# Patient Record
Sex: Male | Born: 1985 | Race: Black or African American | Hispanic: No | Marital: Single | Smoking: Current some day smoker
Health system: Southern US, Community
[De-identification: ages and names within clinical notes are randomized; demographics above are authoritative.]

## PROBLEM LIST (undated history)

## (undated) DIAGNOSIS — J329 Chronic sinusitis, unspecified: Secondary | ICD-10-CM

## (undated) DIAGNOSIS — S62309A Unspecified fracture of unspecified metacarpal bone, initial encounter for closed fracture: Secondary | ICD-10-CM

## (undated) DIAGNOSIS — K2970 Gastritis, unspecified, without bleeding: Secondary | ICD-10-CM

## (undated) HISTORY — PX: NO PAST SURGERIES: SHX2092

---
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
## 2009-02-16 ENCOUNTER — Emergency Department (HOSPITAL_COMMUNITY): Admission: EM | Admit: 2009-02-16 | Discharge: 2009-02-16 | Payer: Self-pay | Admitting: Emergency Medicine

## 2012-07-24 ENCOUNTER — Emergency Department (HOSPITAL_COMMUNITY)
Admission: EM | Admit: 2012-07-24 | Discharge: 2012-07-24 | Disposition: A | Discharge status: Home / Self Care | Payer: No Typology Code available for payment source | Attending: Emergency Medicine | Admitting: Emergency Medicine

## 2012-07-24 ENCOUNTER — Encounter (HOSPITAL_COMMUNITY): Payer: Self-pay | Admitting: Emergency Medicine

## 2012-07-24 DIAGNOSIS — M549 Dorsalgia, unspecified: Secondary | ICD-10-CM | POA: Insufficient documentation

## 2012-07-24 DIAGNOSIS — R51 Headache: Secondary | ICD-10-CM | POA: Insufficient documentation

## 2012-07-24 DIAGNOSIS — Y9389 Activity, other specified: Secondary | ICD-10-CM | POA: Insufficient documentation

## 2012-07-24 DIAGNOSIS — Y92410 Unspecified street and highway as the place of occurrence of the external cause: Secondary | ICD-10-CM | POA: Insufficient documentation

## 2012-07-24 DIAGNOSIS — S139XXA Sprain of joints and ligaments of unspecified parts of neck, initial encounter: Secondary | ICD-10-CM

## 2012-07-24 MED ORDER — OXYCODONE-ACETAMINOPHEN 5-325 MG PO TABS
1.0000 | ORAL_TABLET | Freq: Once | ORAL | Status: AC
  Administered 2012-07-24: 1 via ORAL
  Filled 2012-07-24: qty 1

## 2012-07-24 MED ORDER — IBUPROFEN 600 MG PO TABS
600.0000 mg | ORAL_TABLET | Freq: Four times a day (QID) | ORAL | Status: DC | PRN
Start: 2012-07-24 — End: 2013-05-19

## 2012-07-24 MED ORDER — CYCLOBENZAPRINE HCL 10 MG PO TABS: 10.0000 mg | ORAL_TABLET | Freq: Two times a day (BID) | ORAL | Status: DC | PRN
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
## 2012-07-24 NOTE — ED Notes (Signed)
C-collar applied in triage.
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
## 2012-07-24 NOTE — ED Notes (Signed)
Ortho notified for cervical soft collar

## 2012-07-25 NOTE — ED Provider Notes (Signed)
Medical screening examination/treatment/procedure(s) were performed by non-physician practitioner and as supervising physician I was immediately available for consultation/collaboration.   Dione Booze, MD 07/25/12 [phone]

## 2013-01-23 ENCOUNTER — Emergency Department (HOSPITAL_COMMUNITY)
Admission: EM | Admit: 2013-01-23 | Discharge: 2013-01-23 | Disposition: A | Discharge status: Home / Self Care | Payer: No Typology Code available for payment source | Attending: Emergency Medicine | Admitting: Emergency Medicine

## 2013-01-23 ENCOUNTER — Encounter (HOSPITAL_COMMUNITY): Payer: Self-pay | Admitting: *Deleted

## 2013-01-23 DIAGNOSIS — Y92410 Unspecified street and highway as the place of occurrence of the external cause: Secondary | ICD-10-CM | POA: Insufficient documentation

## 2013-01-23 DIAGNOSIS — Z79899 Other long term (current) drug therapy: Secondary | ICD-10-CM | POA: Insufficient documentation

## 2013-01-23 DIAGNOSIS — M7918 Myalgia, other site: Secondary | ICD-10-CM

## 2013-01-23 DIAGNOSIS — IMO0002 Reserved for concepts with insufficient information to code with codable children: Secondary | ICD-10-CM | POA: Insufficient documentation

## 2013-01-23 DIAGNOSIS — F17200 Nicotine dependence, unspecified, uncomplicated: Secondary | ICD-10-CM | POA: Insufficient documentation

## 2013-01-23 DIAGNOSIS — Y9389 Activity, other specified: Secondary | ICD-10-CM | POA: Insufficient documentation

## 2013-01-23 MED ORDER — IBUPROFEN 800 MG PO TABS
800.0000 mg | ORAL_TABLET | Freq: Once | ORAL | Status: AC
  Administered 2013-01-23: 800 mg via ORAL
  Filled 2013-01-23: qty 1

## 2013-01-23 MED ORDER — DIAZEPAM 5 MG PO TABS: 5.0000 mg | ORAL_TABLET | Freq: Four times a day (QID) | ORAL | Status: DC | PRN

## 2013-01-23 NOTE — ED Provider Notes (Signed)
Medical screening examination/treatment/procedure(s) were performed by non-physician practitioner and as supervising physician I was immediately available for consultation/collaboration.    Vida Roller, MD 01/23/13 [phone]
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
## 2013-05-19 ENCOUNTER — Emergency Department (HOSPITAL_COMMUNITY)
Admission: EM | Admit: 2013-05-19 | Discharge: 2013-05-19 | Disposition: A | Discharge status: Home / Self Care | Payer: Self-pay | Attending: Emergency Medicine | Admitting: Emergency Medicine

## 2013-05-19 ENCOUNTER — Encounter (HOSPITAL_COMMUNITY): Payer: Self-pay | Admitting: Emergency Medicine

## 2013-05-19 DIAGNOSIS — K089 Disorder of teeth and supporting structures, unspecified: Secondary | ICD-10-CM | POA: Insufficient documentation

## 2013-05-19 DIAGNOSIS — R509 Fever, unspecified: Secondary | ICD-10-CM | POA: Insufficient documentation

## 2013-05-19 DIAGNOSIS — K0381 Cracked tooth: Secondary | ICD-10-CM | POA: Insufficient documentation

## 2013-05-19 DIAGNOSIS — R220 Localized swelling, mass and lump, head: Secondary | ICD-10-CM | POA: Insufficient documentation

## 2013-05-19 DIAGNOSIS — R51 Headache: Secondary | ICD-10-CM | POA: Insufficient documentation

## 2013-05-19 DIAGNOSIS — F17200 Nicotine dependence, unspecified, uncomplicated: Secondary | ICD-10-CM | POA: Insufficient documentation

## 2013-05-19 DIAGNOSIS — K029 Dental caries, unspecified: Secondary | ICD-10-CM | POA: Insufficient documentation

## 2013-05-19 MED ORDER — PENICILLIN V POTASSIUM 500 MG PO TABS
500.0000 mg | ORAL_TABLET | Freq: Three times a day (TID) | ORAL | Status: DC
Start: 2013-05-19 — End: 2015-11-01

## 2013-05-19 MED ORDER — HYDROCODONE-ACETAMINOPHEN 5-325 MG PO TABS: 2.0000 | ORAL_TABLET | ORAL | Status: DC | PRN

## 2013-05-19 NOTE — ED Notes (Signed)
Pt c/o of left dental pain.  Pt has two decade teeth on left lower and three on the top left.

## 2013-05-19 NOTE — ED Provider Notes (Signed)
CSN: [id number]     Arrival date & time 05/19/13  1747 History  This chart was scribed for non-physician practitioner Dierdre Forth, PA-C, working with Enid Skeens, MD by Dorothey Baseman, ED Scribe. This patient was seen in room TR06C/TR06C and the patient's care was started at 8:18 PM.    Chief Complaint  Patient presents with  . Dental Pain    Left   Patient is a 27 y.o. male presenting with tooth pain. The history is provided by the patient and medical records. No language interpreter was used.  Dental Pain Location:  Upper and lower Severity:  Moderate Onset quality:  Sudden Timing:  Intermittent Context: dental fracture and poor dentition   Relieved by:  Nothing Ineffective treatments: aspirin. Associated symptoms: facial pain, facial swelling, fever and headaches   Associated symptoms: no drooling and no neck pain    HPI Comments: Ralph Clark is a 27 y.o. male who presents to the Emergency Department complaining of an intermittent pain to the left, lower dentition onset 2 weeks ago and pain to the left, upper dentition onset 2 days ago. He reports associated headache, facial pain, facial swelling, and fever (101, measured at home) 2 days ago, but now resolved. He reports taking Bayer at home with relief of his fever, but not the pain. Patient reports a history of broken teeth and other dental problems. He denies ear pain. He denies any other pertinent medical history.  History reviewed. No pertinent past medical history. History reviewed. No pertinent past surgical history. No family history on file. History  Substance Use Topics  . Smoking status: Current Every Day Smoker -- 0.50 packs/day  . Smokeless tobacco: Not on file  . Alcohol Use: No    Review of Systems  Constitutional: Positive for fever. Negative for chills and appetite change.  HENT: Positive for dental problem and facial swelling. Negative for drooling, ear pain, nosebleeds, postnasal drip, rhinorrhea and  trouble swallowing.   Eyes: Negative for pain and redness.  Respiratory: Negative for cough and wheezing.   Cardiovascular: Negative for chest pain.  Gastrointestinal: Negative for nausea, vomiting and abdominal pain.  Musculoskeletal: Negative for neck pain and neck stiffness.  Skin: Negative for color change and rash.  Neurological: Positive for headaches. Negative for weakness and light-headedness.  All other systems reviewed and are negative.    Allergies  Review of patient's allergies indicates no known allergies.  Home Medications   Current Outpatient Rx  Name  Route  Sig  Dispense  Refill  . cyclobenzaprine (FLEXERIL) 10 MG tablet   Oral   Take 1 tablet (10 mg total) by mouth 2 (two) times daily as needed for muscle spasms.   20 tablet   0   . diazepam (VALIUM) 5 MG tablet   Oral   Take 1 tablet (5 mg total) by mouth every 6 (six) hours as needed (muscle spasms).   10 tablet   0   . HYDROcodone-acetaminophen (NORCO/VICODIN) 5-325 MG per tablet   Oral   Take 2 tablets by mouth every 4 (four) hours as needed for pain.   6 tablet   0   . penicillin v potassium (VEETID) 500 MG tablet   Oral   Take 1 tablet (500 mg total) by mouth 3 (three) times daily.   30 tablet   0    Triage Vitals: BP 143/74  Pulse 70  Temp(Src) 97.6 F (36.4 C) (Oral)  Resp 16  Ht 5\' 8"  (1.727 m)  Wt 180 lb (81.647 kg)  BMI 27.38 kg/m2  SpO2 100%  Physical Exam  Nursing note and vitals reviewed. Constitutional: He appears well-developed and well-nourished.  HENT:  Head: Normocephalic.  Right Ear: Tympanic membrane, external ear and ear canal normal.  Left Ear: Tympanic membrane, external ear and ear canal normal.  Nose: Nose normal. Right sinus exhibits no maxillary sinus tenderness and no frontal sinus tenderness. Left sinus exhibits no maxillary sinus tenderness and no frontal sinus tenderness.  Mouth/Throat: Uvula is midline, oropharynx is clear and moist and mucous membranes  are normal. No oral lesions. Abnormal dentition. Dental caries present. No dental abscesses, uvula swelling or lacerations. No oropharyngeal exudate, posterior oropharyngeal edema, posterior oropharyngeal erythema or tonsillar abscesses.    Mild, left-sided facial swelling. No evidence of dental abscess; no induration or erythema of the skin  Eyes: Conjunctivae are normal. Pupils are equal, round, and reactive to light. Right eye exhibits no discharge. Left eye exhibits no discharge.  Neck: Normal range of motion. Neck supple.  Cardiovascular: Normal rate, regular rhythm, normal heart sounds and intact distal pulses.   No murmur heard. Pulmonary/Chest: Effort normal and breath sounds normal. No respiratory distress. He has no wheezes.  Abdominal: Soft. Bowel sounds are normal. He exhibits no distension. There is no tenderness.  Lymphadenopathy:    He has no cervical adenopathy.  Neurological: He is alert.  Skin: Skin is warm and dry. No rash noted. No erythema.  Psychiatric: He has a normal mood and affect.    ED Course  Dental Date/Time: 05/19/2013 8:30 PM Performed by: Dierdre Forth Authorized by: Dierdre Forth Consent: Verbal consent obtained. Risks and benefits: risks, benefits and alternatives were discussed Consent given by: patient Patient understanding: patient states understanding of the procedure being performed Patient consent: the patient's understanding of the procedure matches consent given Procedure consent: procedure consent matches procedure scheduled Relevant documents: relevant documents present and verified Site marked: the operative site was marked Required items: required blood products, implants, devices, and special equipment available Patient identity confirmed: verbally with patient and arm band Time out: Immediately prior to procedure a "time out" was called to verify the correct patient, procedure, equipment, support staff and site/side marked  as required. Preparation: Patient was prepped and draped in the usual sterile fashion. Local anesthesia used: yes Local anesthetic: bupivacaine 0.5% with epinephrine Anesthetic total: 0.8 ml Patient sedated: no Patient tolerance: Patient tolerated the procedure well with no immediate complications. Comments: Dental block of tooth #16 with complete relief of pain   (including critical care time)  DIAGNOSTIC STUDIES: Oxygen Saturation is 100% on room air, normal by my interpretation.    COORDINATION OF CARE: 8:21 PM- Will order an injection of bupivacaine. Will discharge patient with penicillin and pain medication to manage symptoms. Advised patient to follow up with the referred dentist. Discussed treatment plan with patient at bedside and patient verbalized agreement.     Labs Review Labs Reviewed - No data to display Imaging Review No results found.  EKG Interpretation   None       MDM   1. Pain due to dental caries    Ralph Clark presents with left upper toothache; poor dentition throughout.  No gross abscess.  Exam unconcerning for Ludwig's angina or spread of infection.  Will treat with penicillin and pain medicine.  Pt given dental block in ED with complete relief of pain. Urged patient to follow-up with dentist.    It has been determined that no acute  conditions requiring further emergency intervention are present at this time. The patient/guardian have been advised of the diagnosis and plan. We have discussed signs and symptoms that warrant return to the ED, such as changes or worsening in symptoms.   Vital signs are stable at discharge.   BP 143/74  Pulse 70  Temp(Src) 97.6 F (36.4 C) (Oral)  Resp 16  Ht 5\' 8"  (1.727 m)  Wt 180 lb (81.647 kg)  BMI 27.38 kg/m2  SpO2 100%  Patient/guardian has voiced understanding and agreed to follow-up with the PCP or specialist.    I personally performed the services described in this documentation, which was scribed  in my presence. The recorded information has been reviewed and is accurate.   Dahlia Client Sloka Volante, PA-C 05/19/13 2040  Dierdre Forth, PA-C 05/19/13 2232

## 2013-05-20 NOTE — ED Provider Notes (Signed)
Medical screening examination/treatment/procedure(s) were performed by non-physician practitioner and as supervising physician I was immediately available for consultation/collaboration.  EKG Interpretation   None         Enid Skeens, MD 05/20/13 [phone]

## 2013-11-05 ENCOUNTER — Emergency Department (HOSPITAL_COMMUNITY)
Admission: EM | Admit: 2013-11-05 | Discharge: 2013-11-05 | Disposition: A | Discharge status: Home / Self Care | Payer: Self-pay | Attending: Emergency Medicine | Admitting: Emergency Medicine

## 2013-11-05 ENCOUNTER — Encounter (HOSPITAL_COMMUNITY): Payer: Self-pay | Admitting: Emergency Medicine

## 2013-11-05 DIAGNOSIS — K029 Dental caries, unspecified: Secondary | ICD-10-CM | POA: Insufficient documentation

## 2013-11-05 DIAGNOSIS — K0889 Other specified disorders of teeth and supporting structures: Secondary | ICD-10-CM

## 2013-11-05 DIAGNOSIS — F17200 Nicotine dependence, unspecified, uncomplicated: Secondary | ICD-10-CM | POA: Insufficient documentation

## 2013-11-05 DIAGNOSIS — R51 Headache: Secondary | ICD-10-CM | POA: Insufficient documentation

## 2013-11-05 DIAGNOSIS — K089 Disorder of teeth and supporting structures, unspecified: Secondary | ICD-10-CM | POA: Insufficient documentation

## 2013-11-05 MED ORDER — PENICILLIN V POTASSIUM 500 MG PO TABS: 500.0000 mg | ORAL_TABLET | Freq: Four times a day (QID) | ORAL | Status: DC

## 2013-11-05 MED ORDER — TRAMADOL HCL 50 MG PO TABS: 50.0000 mg | ORAL_TABLET | Freq: Four times a day (QID) | ORAL | Status: DC | PRN

## 2013-11-05 MED ORDER — PENICILLIN V POTASSIUM 250 MG PO TABS
500.0000 mg | ORAL_TABLET | Freq: Once | ORAL | Status: AC
  Administered 2013-11-05: 500 mg via ORAL
  Filled 2013-11-05: qty 2

## 2013-11-05 MED ORDER — TRAMADOL HCL 50 MG PO TABS
50.0000 mg | ORAL_TABLET | Freq: Once | ORAL | Status: AC
  Administered 2013-11-05: 50 mg via ORAL
  Filled 2013-11-05: qty 1

## 2013-11-05 NOTE — Discharge Instructions (Signed)
Take Tramadol as needed for pain. Take Veetid as directed until gone for possible infection. Follow up with a dentist from the list below. Refer to attached documents for more information.    Emergency Department Resource Guide 1) Find a Doctor and Pay Out of Pocket Although you won't have to find out who is covered by your insurance plan, it is a good idea to ask around and get recommendations. You will then need to call the office and see if the doctor you have chosen will accept you as a new patient and what types of options they offer for patients who are self-pay. Some doctors offer discounts or will set up payment plans for their patients who do not have insurance, but you will need to ask so you aren't surprised when you get to your appointment.  2) Contact Your Local Health Department Not all health departments have doctors that can see patients for sick visits, but many do, so it is worth a call to see if yours does. If you don't know where your local health department is, you can check in your phone book. The CDC also has a tool to help you locate your state's health department, and many state websites also have listings of all of their local health departments.  3) Find a Walk-in Clinic If your illness is not likely to be very severe or complicated, you may want to try a walk in clinic. These are popping up all over the country in pharmacies, drugstores, and shopping centers. They're usually staffed by nurse practitioners or physician assistants that have been trained to treat common illnesses and complaints. They're usually fairly quick and inexpensive. However, if you have serious medical issues or chronic medical problems, these are probably not your best option.  No Primary Care Doctor: - Call Health Connect at  [phone] - they can help you locate a primary care doctor that  accepts your insurance, provides certain services, etc. - Physician Referral Service- [phone]  Chronic  Pain Problems: Organization         Address  Phone   Notes  [facility]  [phone]) [phone] Patients need to be referred by their primary care doctor.   Medication Assistance: Organization         Address  Phone   Notes  [facility] [place] [facility] [address] [place], [place] [id number] [phone](336) [phone] --Must be a resident of [place] -- Must have NO insurance coverage whatsoever (no Medicaid/ Medicare, etc.) -- The pt. MUST have a primary care doctor that directs their care regularly and follows them in the community   Med[facility]ist  [phone]   [employer]  [phone]    Agencies that provide inexpensive medical care: Organization         Address  Phone   Notes  [facility]  [phone]   [facility]    [phone]) [phone]   [facility] [address] [place], [place] [address] [phone]   [facility] [address], [place] [phone]   [facility]anned [facility]    [phone](336) [phone]   [facility]    [phone]   [facility]  [address], [place] Phone:  [phone], Fax:  [phone] Hours of Operation:  9 am - 6 pm, M-F.  Also accepts Medicaid/Medicare and self-pay.  [facility]  [address], [place] Phone: [phone], Fax: [phone]. Hours of Operation:  8:30 am - 5:30 pm, M-F.  Also accepts Medicaid and self-pay.  °[facility] [place] [address], [place] Phone: [phone]   °[facility] [address], [place], [place] [phone], Ext. 123 Mondays & Thursdays: 7-9 AM.  First 15 patients are seen on a first come, first serve basis. °  ° °Medicaid-accepting [place] Providers: ° °Organization         Address  Phone   Notes  °[facility] [address], [place] [phone] Also  accepts self-pay patients.  °[facility] [address], [place] ° [phone]   °[facility] [address], [place] [phone]   °[facility] [address] [place] [address], [place] [phone]   °Veita Bland [address], [place]  ° [phone] Only accepts [place] Access Medicaid patients after they have their name applied to their card.  ° °Self-Pay (no insurance) in [place]: ° °Organization         Address  Phone   Notes  °Sickle Cell Patients, [facility] [address], [place] [phone]   °[facility] [address], [place] [phone]   °[facility] [place] ° [address], [place] [phone]   °[facility]/Dr. Osei-Bonsu ° [address] [place] [address], [place] or [address], [place] [phone] Phone number for both [place] and [place] locations is the same.  °[facility] [address], [place] [phone]) [phone]   °[facility] [address] [place] [address], [place] or [address] [phone]) [phone] °([phone]) [phone]   °[facility] [address], [place] [phone], phone; [phone], fax Sees patients 1st and 3rd Saturday of every month.  Must not qualify for public or private insurance (i.e. Medicaid, Medicare, [place] Health Choice, Veterans' Benefits) • Household income should be no more than 200% of the poverty level •The clinic cannot treat you if you are pregnant or think you are pregnant • Sexually transmitted diseases are not treated at the clinic.  ° ° °Dental Care: °Organization         Address  Phone  Notes  °[place] [facility] [address], [place] [phone]) [phone] Accepts children up to age 21 who are enrolled in Medicaid or [place] Health Choice; pregnant  women with a Medicaid card; and children who have applied for Medicaid or [place] Health Choice, but were declined, whose parents can pay a reduced fee at time of service.  °[place] [facility] [place]  [address], [place] [phone]) [phone] Accepts children up to age 21 who are enrolled in Medicaid or [place] Health Choice; pregnant women with a Medicaid card; and children who have applied for Medicaid or  Health Choice, but were declined, whose parents can pay a reduced fee at time of service.  °[facility] ° [address],  [phone] Patients are seen by appointment only. Walk-ins are not accepted. [facility] will see patients 18 years of age and older. °Monday - Tuesday (8am-5pm) °Most Wednesdays (8:30-5pm) °$30 per visit, cash only  °[facility]  [address], [facility] [phone] Patients are seen by appointment only. Walk-ins are not accepted. [place] will see patients 60 years of age and older. One Wednesday Evening (Monthly: Volunteer Based).  $30 per visit, cash only  [place]  [phone] for adults; Children under age 4, call Graduate Pediatric Dentistry at [phone]. Children aged 49-14, please call [phone] to request a pediatric application.  Dental services are provided in all areas of dental care including fillings, crowns and bridges, complete and partial dentures, implants, gum treatment, root canals, and extractions. Preventive care is also provided. Treatment is provided to both adults and children. Patients are selected via a lottery and there is often a waiting list.   [facility] [address] [place]  [phone] [email or web address]   [facility] [address] [place], [place] [phone], Ext. [phone] Second and Fourth Thursday of each month, opens at 6:30 AM; Clinic ends at 9 AM.  Patients are  seen on a first-come first-served basis, and a limited number are seen during each clinic.   [facility]  [address] Hillard Danker [place], [place] [phone]   Eligibility Requirements You must have lived in [place], [place], or [place] counties for at least the last three months.   You cannot be eligible for state or federal sponsored [employer], including [employer], [place], or [employer].   You generally cannot be eligible for healthcare insurance through your employer.    How to apply: Eligibility screenings are held every Tuesday and Wednesday afternoon from 1:00 pm until 4:00 pm. You do not need an appointment for the interview!  [facility] [address] [place], [place]   [place] Department  [place]  [phone]    Behavioral Health Resources in the Community: Intensive Outpatient Programs Organization         Address  Phone  Notes  [facility] [place]. [address], [place], [place] [phone]   [facility]patient [address], [place], [place]   [facility]: [facility] [address] [address], [place], [place]   [place] [address],  [place], [place] or [phone]   Substance Abuse Resources Organization         Address  Phone  Notes  [facility]  [phone]   [place]  [phone]   [facility] [place]   Chinita Pester  [phone]   Residential & Outpatient Substance Abuse Program  [phone]   Psychological Services Organization         Address  Phone  Notes  [facility] [place]  [place]  [phone]   [place] [address] [place] [phone] or [phone]    Mobile Crisis  Teams Organization         Address  Phone  Notes  [facility], Mobile Crisis Care Unit  [phone]   [facility]  [address] [place], [place]   [facility] [address] [place] [phone]    Self-Help/Support Groups Organization         Address  Phone             Notes  [facility]  [facility] [place] - variety of support groups  [place] Call for more information  Narcotics Anonymous (NA), [facility] [address], [employer] [place]  2 meetings at this location   Special educational needs teacher         Address  Phone  Notes  [facility] [place],    [place]  [phone])[phone]   [facility]  [address], [place] [address], [place], [place]   [place], [place] [phone] Admissions: 8am-3pm M-F  Incentives Substance [place] [address],    [place], [place] [phone]   [facility] [address] [place], [place], [place]   [facility] [address]  [place], [place]   [facility] - Intensive Outpatient [address], [place], [place]   [facility] [place]  [place], [place] [phone] or [phone]   [facility] ([facility]) [address] [place], [place] Accepts Medicaid  [facility] [place] [address],  [place] [phone] Substance Abuse/Addiction Treatment   [facility] Organization         Address  Phone  Notes  [facility]  [phone]   Domenic Schwab, PhD [address] Arlis Port[place], [place]   [phone] or [phone]   [place], [place] [phone]   [facility] [address], [place], [place] [phone]  Insurance/Medicaid/sponsorship through [facility] [address] [place] [address]                                    [place], [place] [phone] [place] [address], [place] [phone]    Dr. Adele Schilder  [phone]   [facility] Dept. 1) [address] [place] 2) [place] 3)  [address] [address], [place] [phone]  [phone]   [place] [phone] or [phone] (After Hours)

## 2013-11-05 NOTE — ED Notes (Signed)
Pt reporting left upper dental pain since last night. States tooth is broken. Pt is a x 4. In NAD taking aleve with no relief

## 2013-11-05 NOTE — ED Notes (Signed)
C/o left upper dental pain. States it is giving him a headache.

## 2013-11-05 NOTE — ED Provider Notes (Signed)
CSN: [id number]     Arrival date & time 11/05/13  1154 History  This chart was scribed for non-physician practitioner, Emilia BeckKaitlyn Koreena Joost, PA-C working with Laray AngerKathleen M McManus, DO by Greggory StallionKayla Andersen, ED scribe. This patient was seen in room TR06C/TR06C and the patient's care was started at 12:33 PM.   Chief Complaint  Patient presents with  . Dental Pain   The history is provided by the patient. No language interpreter was used.   HPI Comments: Ralph Clark is a 28 y.o. male who presents to the Emergency Department complaining of gradual onset left upper dental pain that started last night. The pain has started to radiate into his head causing a headache. He states the tooth has been broken for one month. Pt has taken 400 mg ibuprofen with no relief. Denies fever.   History reviewed. No pertinent past medical history. History reviewed. No pertinent past surgical history. No family history on file. History  Substance Use Topics  . Smoking status: Current Every Day Smoker -- 0.50 packs/day  . Smokeless tobacco: Not on file  . Alcohol Use: No    Review of Systems  Constitutional: Negative for fever.  HENT: Positive for dental problem.   Neurological: Positive for headaches.  All other systems reviewed and are negative.  Allergies  Review of patient's allergies indicates no known allergies.  Home Medications   Prior to Admission medications   Medication Sig Start Date End Date Taking? Authorizing Provider  cyclobenzaprine (FLEXERIL) 10 MG tablet Take 1 tablet (10 mg total) by mouth 2 (two) times daily as needed for muscle spasms. 07/24/12   Jimmye Normanavid John Smith, NP  diazepam (VALIUM) 5 MG tablet Take 1 tablet (5 mg total) by mouth every 6 (six) hours as needed (muscle spasms). 01/23/13   Nicole Pisciotta, PA-C  HYDROcodone-acetaminophen (NORCO/VICODIN) 5-325 MG per tablet Take 2 tablets by mouth every 4 (four) hours as needed for pain. 05/19/13   Hannah Muthersbaugh, PA-C  penicillin v  potassium (VEETID) 500 MG tablet Take 1 tablet (500 mg total) by mouth 3 (three) times daily. 05/19/13   Hannah Muthersbaugh, PA-C   BP 130/74  Pulse 75  Temp(Src) 98.8 F (37.1 C) (Oral)  Resp 15  Ht 5\' 9"  (1.753 m)  Wt 180 lb (81.647 kg)  BMI 26.57 kg/m2  SpO2 97%  Physical Exam  Nursing note and vitals reviewed. Constitutional: He is oriented to person, place, and time. He appears well-developed and well-nourished. No distress.  HENT:  Head: Normocephalic and atraumatic.  Poor dentition. Multiple cracked and decayed molars. Left upper posterior molar tender to percussion. No dental abscess noted.   Eyes: EOM are normal.  Neck: Neck supple. No tracheal deviation present.  Cardiovascular: Normal rate.   Pulmonary/Chest: Effort normal. No respiratory distress.  Musculoskeletal: Normal range of motion.  Neurological: He is alert and oriented to person, place, and time.  Skin: Skin is warm and dry.  Psychiatric: He has a normal mood and affect. His behavior is normal.    ED Course  Procedures (including critical care time)  DIAGNOSTIC STUDIES: Oxygen Saturation is 97% on RA, normal by my interpretation.    COORDINATION OF CARE: 12:34 PM-Discussed treatment plan which includes an antibiotic and pain medication with pt at bedside and pt agreed to plan. Will give pt dental referrals and advised him to follow up.   Labs Review Labs Reviewed - No data to display  Imaging Review No results found.   EKG Interpretation None  MDM   Final diagnoses:  Pain, dental    Patient will have Veetid and Tramadol for pain. Vitals stable and patient afebrile. Patient given a resource guide for dental follow up.   I personally performed the services described in this documentation, which was scribed in my presence. The recorded information has been reviewed and is accurate.  Emilia BeckKaitlyn Jonte Wollam, PA-C 11/05/13 1542

## 2013-11-06 NOTE — ED Provider Notes (Signed)
Medical screening examination/treatment/procedure(s) were performed by non-physician practitioner and as supervising physician I was immediately available for consultation/collaboration.   EKG Interpretation None        Savian Mazon M Demetrias Goodbar, DO 11/06/13 0735 

## 2013-11-09 ENCOUNTER — Encounter (HOSPITAL_COMMUNITY): Payer: Self-pay | Admitting: Emergency Medicine

## 2013-11-09 ENCOUNTER — Emergency Department (HOSPITAL_COMMUNITY)
Admission: EM | Admit: 2013-11-09 | Discharge: 2013-11-09 | Disposition: A | Discharge status: Home / Self Care | Payer: Self-pay | Attending: Emergency Medicine | Admitting: Emergency Medicine

## 2013-11-09 DIAGNOSIS — Z792 Long term (current) use of antibiotics: Secondary | ICD-10-CM | POA: Insufficient documentation

## 2013-11-09 DIAGNOSIS — Z202 Contact with and (suspected) exposure to infections with a predominantly sexual mode of transmission: Secondary | ICD-10-CM

## 2013-11-09 DIAGNOSIS — Z79899 Other long term (current) drug therapy: Secondary | ICD-10-CM | POA: Insufficient documentation

## 2013-11-09 DIAGNOSIS — F17200 Nicotine dependence, unspecified, uncomplicated: Secondary | ICD-10-CM | POA: Insufficient documentation

## 2013-11-09 DIAGNOSIS — Z113 Encounter for screening for infections with a predominantly sexual mode of transmission: Secondary | ICD-10-CM | POA: Insufficient documentation

## 2013-11-09 DIAGNOSIS — R109 Unspecified abdominal pain: Secondary | ICD-10-CM | POA: Insufficient documentation

## 2013-11-09 MED ORDER — CEFTRIAXONE SODIUM 250 MG IJ SOLR
250.0000 mg | Freq: Once | INTRAMUSCULAR | Status: AC
  Administered 2013-11-09: 250 mg via INTRAMUSCULAR
  Filled 2013-11-09: qty 250

## 2013-11-09 MED ORDER — AZITHROMYCIN 250 MG PO TABS
1000.0000 mg | ORAL_TABLET | Freq: Once | ORAL | Status: AC
  Administered 2013-11-09: 1000 mg via ORAL
  Filled 2013-11-09: qty 4

## 2013-11-09 MED ORDER — STERILE WATER FOR INJECTION IJ SOLN
INTRAMUSCULAR | Status: AC
  Administered 2013-11-09: 2.1 mL
  Filled 2013-11-09: qty 10

## 2013-11-09 NOTE — Discharge Instructions (Signed)
Refrain from sexual intercourse for 7 days. Be sure to have all partners tested and treated for STDs by primary care and health department.  Practice safe sex by always wearing condoms.   Emergency Department Resource Guide 1) Find a Doctor and Pay Out of Pocket Although you won't have to find out who is covered by your insurance plan, it is a good idea to ask around and get recommendations. You will then need to call the office and see if the doctor you have chosen will accept you as a new patient and what types of options they offer for patients who are self-pay. Some doctors offer discounts or will set up payment plans for their patients who do not have insurance, but you will need to ask so you aren't surprised when you get to your appointment.  2) Contact Your Local Health Department Not all health departments have doctors that can see patients for sick visits, but many do, so it is worth a call to see if yours does. If you don't know where your local health department is, you can check in your phone book. The CDC also has a tool to help you locate your state's health department, and many state websites also have listings of all of their local health departments.  3) Find a Walk-in Clinic If your illness is not likely to be very severe or complicated, you may want to try a walk in clinic. These are popping up all over the country in pharmacies, drugstores, and shopping centers. They're usually staffed by nurse practitioners or physician assistants that have been trained to treat common illnesses and complaints. They're usually fairly quick and inexpensive. However, if you have serious medical issues or chronic medical problems, these are probably not your best option.  No Primary Care Doctor: - Call Health Connect at  [phone] - they can help you locate a primary care doctor that  accepts your insurance, provides certain services, etc. - Physician Referral Service- [phone]  Chronic Pain  Problems: Organization         Address  Phone   Notes  [facility]  [phone]) [phone] Patients need to be referred by their primary care doctor.   Medication Assistance: Organization         Address  Phone   Notes  [facility] [address] [place], [place] [address] [phone] --Must be a resident of [place] -- Must have NO insurance coverage whatsoever (no Medicaid/ Medicare, etc.) -- The pt. MUST have a primary care doctor that directs their care regularly and follows them in the community   MedAssist  [phone]   [employer]  [phone]) [phone]    Agencies that provide inexpensive medical care: Buyer, retailrganization         Address                                                       Phone  Notes  [facility]  [phone]   [facility]    [phone]   [facility] [address] [place], [place] [address] [phone]   [facility] [address] [place] [phone]   Planned Parenthood    [phone]   [facility]    [phone]   Community Health and [facility]  [address], [place] Phone:  [phone], Fax:  [phone] Hours of Operation:  9 am - 6 pm, M-F.  Also accepts Medicaid/Medicare and self-pay.  [facility]  [address], [place] Phone: [phone], Fax: [phone]. Hours of Operation:  8:30 am - 5:30 pm, M-F.  Also accepts Medicaid and self-pay.  [facility] [place] [address] [place] Phone: [phone]   [facility] [address] Natasha Bence [place], [place] [phone], Ext. [phone] Mondays & Thursdays: 7-9 AM.  First 15 patients are seen on a first come, first serve basis.    Medicaid-accepting [facility] Providers:  Organization          Address                                                                       Phone                               Notes  [facility] [address], [place] [phone] Also accepts self-pay patients.  [facility] [address] Laurell Josephs [place], [place]  [phone]   [facility] [address], [place] [phone]   [facility] [address] [place] [phone]   Renaye Rakers [address], [place]   [phone] Only accepts [place] patients after they have their name applied to their card.   Self-Pay (no insurance) in [facility]:   Organization         Address                                                     Phone               Notes  Sickle Cell Patients, [facility] [address] [place], [place] [phone]   [facility] [address] [place], [place] [phone]   [facility] [place]  [address], [place] [phone]   [facility]/Dr. Osei-Bonsu  [address] [place] or [address], [place] [phone] Phone number for both [place] and [place] locations is the same.  Urgent Medical and [facility] [address], [place] [phone]   [facility] [address], [place] or [address] [phone]   [facility] [address], [place] [phone]) [phone], phone; [phone], fax Sees patients 1st and 3rd Saturday of every month.  Must not qualify for public or private insurance (i.e. Medicaid, Medicare, [place] Health Choice, Veterans' Benefits)  Household income should be no more than 200% of the poverty level The clinic cannot treat you if you are pregnant or think you are pregnant  Sexually transmitted diseases are not treated at  the clinic.    Dental Care: Organization         Address                                  Phone                       Notes  [facility] [address], [place] [phone]) [phone] Accepts children up to age 26 who are enrolled in [place] or [place] Health Choice; pregnant women with a Medicaid card; and children who have applied for Medicaid or [place] Health Choice, but were declined, whose parents can pay a reduced fee at time of service.  [facility] [place]  [address], [place] [phone]) [phone] Accepts children up to age 26 who are enrolled in [place] or [place] Health Choice; pregnant women with a Medicaid card; and children who have applied for Medicaid or [place] Health Choice, but were declined, whose parents can pay a reduced fee at time of service.  [facility]  [address], [place] [phone]) [phone] Patients are seen by appointment only. Walk-ins are not accepted. [facility] will see patients 26 years of age and older. Monday - Tuesday (8am-5pm) Most Wednesdays (8:30-5pm) $30 per visit, cash only  [facility]  [address], [facility] [phone]) [phone] Patients are seen by appointment only. Walk-ins are not accepted. [facility] will see patients 26 years of age and older. One Wednesday Evening (Monthly: Volunteer Based).  $30 per visit, cash only  [facility] [facility]  [phone] for adults; Children under age 654, call Graduate Pediatric Dentistry at [phone]. Children aged 704-14, please call [phone] to request a pediatric application.  Dental services are provided in all areas of dental care including fillings, crowns and bridges, complete and partial dentures, implants, gum treatment, root canals, and extractions. Preventive care is also provided. Treatment is provided to both adults and  children. Patients are selected via a lottery and there is often a waiting list.   [facility] [address], [place]  [phone] [email or web address]   [facility] [address], [place], [place] [phone], Ext. [phone] Second and Fourth Thursday of each month, opens at 6:30 AM; Clinic ends at 9 AM.  Patients are seen on a first-come first-served basis, and a limited number are seen during each clinic.   [facility]  [address], [place], [place] [phone]   Eligibility Requirements You must have lived in [place], [place], or [place] counties for at least the last three months.   You cannot be eligible for state or federal sponsored National Cityhealthcare insurance, including CIGNAVeterans Administration, [place]icaid, or [employer]Medicare.   You generally cannot be eligible for healthcare insurance through your employer.    How to apply: Eligibility screenings are held every  Tuesday and Wednesday afternoon from 1:00 pm until 4:00 pm. You do not need an appointment for the interview!  [facility] [address] [place], [place] [phone]   [facility] Health [facility]  [phone]   [facility] Department  [phone]   [facility]  [phone]    Behavioral Health Resources in the Community: Intensive Outpatient Programs Organization         Address                                              Phone              Notes  [facility] [address] [place], [place] [phone]   [facility] [address] [place], [place] [phone]   [facility] [address], [place], [place]  [phone]   [facility] [address]  [place], [place] [phone] or [phone]   Substance Abuse Resources Organization         Address                                Phone  Notes  [facility] Drug Services  [phone]    [facility]  [phone]   [facility]  [phone]   Floydene Flock  [phone]   Residential & Outpatient Substance Abuse Program  [phone]   Psychological Services Organization         Address                                  Phone                Notes  [facility]  [phone]   [facility]  [phone]   [facility] [address] [place] [phone] or [phone]    Mobile Crisis Teams Organization         Address  Phone  Notes  [facility], Mobile Crisis Care Unit  [phone]   [facility]  [address] [place], [place] [phone]   Doristine Locks [address] [place] [phone]    Self-Help/Support Groups Organization         Address                         Phone             Notes  [facility] [place] - variety of support groups  [phone]- I7437963 Call for more information  Narcotics Anonymous (NA), [facility] [address], [employer]   2 meetings at this location   Statistician         Address                                                    Phone              Notes  [facility] [phone]  [address]    [place]  [phone]   [facility]  [address] [place] [address], [place], [place] [phone]   [facility] [address] [place], [place] [phone] Admissions: 8am-3pm M-F  [facility] [address]    [place], [place] [phone]   [facility] [address] [place], [place], [place] [phone]   [facility] [address]  [place], [place] [phone]   [facility] - Intensive Outpatient [address] Laurell Josephs [address], [place], [place] [phone]   [facility] ([facility] [address]  [place], [place] [phone] or [phone]   Residential  Treatment Services (RTS) [address], [place], [place] [phone] Accepts Medicaid  [facility] [place] [facility] [address],  [place] [phone] Substance Abuse/Addiction Treatment   [facility] Organization         Address                                                            Phone                    Notes  [facility]  [phone]   Angie Fava, PhD [address] Ervin Knack [place], [place]   [phone] or [phone]   [facility] Behavioral   [address] [place], [place] [phone]   [facility] [address], [place], [place] [phone] Insurance/Medicaid/sponsorship through [facility] and Families [address]                                    [place], [place] [phone] Therapy/tele-psych/case  [facility] [address], [place] [phone]    Dr. Lolly Mustache  [phone]   [facility] [place]  [employer] [facility] Dept. 1) [address], [place] 2) [address], [place] 3)  [address], [place] [phone]  [phone]   [facility] Child Abuse Hotline 308-742-9082 or 479-304-0204 (After Hours)

## 2013-11-09 NOTE — ED Notes (Addendum)
Pt states that he was told that sex partner told him she was diagnosed with gonorrhea and that he needed to be examined and treated for it. C/o lower abdominal pain since this am. Denies penile discharge, odor, or dysuria.

## 2013-11-09 NOTE — ED Notes (Signed)
Pt has never had rocephin or zithromax. Pt must wait 30 minutes to ensure no allergic reaction to the medication.

## 2013-11-09 NOTE — ED Provider Notes (Signed)
CSN: [id number]     Arrival date & time 11/09/13  1946 History   First MD Initiated Contact with Patient 11/09/13 1956     Chief Complaint  Patient presents with  . Exposure to STD     (Consider location/radiation/quality/duration/timing/severity/associated sxs/prior Treatment) HPI Pt is a 27yo male presenting to ED with concern for exposure to STD, reports having been told by a male he had intercourse with that she was dx with Gonorrhea. Pt reports lower pelvic pain, intermittent, mild and aching. Denies penile discharge, tenderness, or rash.  Admits to unprotected sex. Denies fever, n/v/d. Denies allergies to medications.   History reviewed. No pertinent past medical history. History reviewed. No pertinent past surgical history. No family history on file. History  Substance Use Topics  . Smoking status: Current Every Day Smoker -- 0.50 packs/day    Types: Cigarettes  . Smokeless tobacco: Not on file  . Alcohol Use: No    Review of Systems  Constitutional: Negative for fever and chills.  Gastrointestinal: Positive for abdominal pain ( "pelvic pain"). Negative for nausea, vomiting and diarrhea.  Genitourinary: Negative for dysuria, urgency, frequency, hematuria, flank pain, decreased urine volume, discharge, penile swelling, scrotal swelling, genital sores, penile pain and testicular pain.  All other systems reviewed and are negative.     Allergies  Review of patient's allergies indicates no known allergies.  Home Medications   Prior to Admission medications   Medication Sig Start Date End Date Taking? Authorizing Provider  cyclobenzaprine (FLEXERIL) 10 MG tablet Take 1 tablet (10 mg total) by mouth 2 (two) times daily as needed for muscle spasms. 07/24/12   Jimmye Normanavid John Smith, NP  diazepam (VALIUM) 5 MG tablet Take 1 tablet (5 mg total) by mouth every 6 (six) hours as needed (muscle spasms). 01/23/13   Nicole Pisciotta, PA-C  HYDROcodone-acetaminophen (NORCO/VICODIN) 5-325 MG  per tablet Take 2 tablets by mouth every 4 (four) hours as needed for pain. 05/19/13   Hannah Muthersbaugh, PA-C  penicillin v potassium (VEETID) 500 MG tablet Take 1 tablet (500 mg total) by mouth 3 (three) times daily. 05/19/13   Hannah Muthersbaugh, PA-C  penicillin v potassium (VEETID) 500 MG tablet Take 1 tablet (500 mg total) by mouth 4 (four) times daily. 11/05/13   Kaitlyn Szekalski, PA-C  traMADol (ULTRAM) 50 MG tablet Take 1 tablet (50 mg total) by mouth every 6 (six) hours as needed. 11/05/13   Kaitlyn Szekalski, PA-C   BP 137/83  Pulse 70  Temp(Src) 98.1 F (36.7 C) (Oral)  Resp 18  Ht 5\' 9"  (1.753 m)  Wt 181 lb 3.2 oz (82.192 kg)  BMI 26.75 kg/m2  SpO2 99% Physical Exam  Nursing note and vitals reviewed. Constitutional: He appears well-developed and well-nourished.  HENT:  Head: Normocephalic and atraumatic.  Eyes: Conjunctivae are normal. No scleral icterus.  Neck: Normal range of motion.  Cardiovascular: Normal rate, regular rhythm and normal heart sounds.   Pulmonary/Chest: Effort normal and breath sounds normal. No respiratory distress. He has no wheezes. He has no rales. He exhibits no tenderness.  Abdominal: Soft. Bowel sounds are normal. He exhibits no distension and no mass. There is no tenderness. There is no rebound and no guarding.  Genitourinary: Penis normal. Right testis shows no mass, no swelling and no tenderness. Right testis is descended. Cremasteric reflex is not absent on the right side. Left testis shows no mass, no swelling and no tenderness. Left testis is descended. Cremasteric reflex is not absent on the left side.  Circumcised. No penile erythema or penile tenderness. No discharge found.  Chaperoned exam. Normal penis.  No discharge. No erythrema or lesions.   Musculoskeletal: Normal range of motion.  Neurological: He is alert.  Skin: Skin is warm and dry.    ED Course  Procedures (including critical care time) Labs Review Labs Reviewed   GC/CHLAMYDIA PROBE AMP    Imaging Review No results found.   EKG Interpretation None      MDM   Final diagnoses:  Exposure to STD    Pt presenting to ED with concern for exposure to gonorrhea. Reports lower pelvic pain but denies penile discharge. STD panel ordered. Pt tx empirically with azithromycin and rocephin. Advised to f/u with PCP and/or health department for further evaluation and treatment of STDs.  Return precautions provided. Pt verbalized understanding and agreement with tx plan.     Junius Finnerrin O'Malley, PA-C 11/10/13 1311

## 2013-11-09 NOTE — ED Notes (Signed)
Pt states that he had coitus with a male who has been DX with gonorrhea.  Pt states they only symptoms he is having is pelvic pain on the left side that is episodic

## 2013-11-10 LAB — GC/CHLAMYDIA PROBE AMP
CT Probe RNA: NEGATIVE
GC Probe RNA: NEGATIVE

## 2013-11-12 NOTE — ED Provider Notes (Signed)
Medical screening examination/treatment/procedure(s) were performed by non-physician practitioner and as supervising physician I was immediately available for consultation/collaboration.   EKG Interpretation None        William Florabelle Cardin, MD 11/12/13 1807 

## 2014-02-19 ENCOUNTER — Emergency Department (HOSPITAL_COMMUNITY)
Admission: EM | Admit: 2014-02-19 | Discharge: 2014-02-19 | Disposition: A | Discharge status: Home / Self Care | Payer: BC Managed Care – PPO | Attending: Emergency Medicine | Admitting: Emergency Medicine

## 2014-02-19 DIAGNOSIS — K029 Dental caries, unspecified: Secondary | ICD-10-CM | POA: Insufficient documentation

## 2014-02-19 DIAGNOSIS — F17200 Nicotine dependence, unspecified, uncomplicated: Secondary | ICD-10-CM | POA: Insufficient documentation

## 2014-02-19 DIAGNOSIS — K0889 Other specified disorders of teeth and supporting structures: Secondary | ICD-10-CM

## 2014-02-19 DIAGNOSIS — K089 Disorder of teeth and supporting structures, unspecified: Secondary | ICD-10-CM | POA: Insufficient documentation

## 2014-02-19 MED ORDER — ONDANSETRON 4 MG PO TBDP
4.0000 mg | ORAL_TABLET | Freq: Once | ORAL | Status: AC
  Administered 2014-02-19: 4 mg via ORAL
  Filled 2014-02-19: qty 1

## 2014-02-19 MED ORDER — HYDROCODONE-ACETAMINOPHEN 5-325 MG PO TABS: 1.0000 | ORAL_TABLET | ORAL | Status: DC | PRN

## 2014-02-19 MED ORDER — HYDROCODONE-ACETAMINOPHEN 5-325 MG PO TABS
1.0000 | ORAL_TABLET | Freq: Once | ORAL | Status: AC
  Administered 2014-02-19: 1 via ORAL
  Filled 2014-02-19: qty 1

## 2014-02-19 MED ORDER — AMOXICILLIN 500 MG PO CAPS: 500.0000 mg | ORAL_CAPSULE | Freq: Three times a day (TID) | ORAL | Status: DC

## 2014-02-19 MED ORDER — BUPIVACAINE-EPINEPHRINE (PF) 0.5% -1:200000 IJ SOLN
1.8000 mL | Freq: Once | INTRAMUSCULAR | Status: AC
  Administered 2014-02-19: 1.8 mL
  Filled 2014-02-19: qty 1.8

## 2014-02-19 NOTE — ED Provider Notes (Signed)
Medical screening examination/treatment/procedure(s) were performed by non-physician practitioner and as supervising physician I was immediately available for consultation/collaboration.   EKG Interpretation None        Gilda Creasehristopher J. Ryli Standlee, MD 02/19/14 [phone]

## 2014-02-19 NOTE — Discharge Instructions (Signed)
Dental Pain °A tooth ache may be caused by cavities (tooth decay). Cavities expose the nerve of the tooth to air and hot or cold temperatures. It may come from an infection or abscess (also called a boil or furuncle) around your tooth. It is also often caused by dental caries (tooth decay). This causes the pain you are having. °DIAGNOSIS  °Your caregiver can diagnose this problem by exam. °TREATMENT  °· If caused by an infection, it may be treated with medications which kill germs (antibiotics) and pain medications as prescribed by your caregiver. Take medications as directed. °· Only take over-the-counter or prescription medicines for pain, discomfort, or fever as directed by your caregiver. °· Whether the tooth ache today is caused by infection or dental disease, you should see your dentist as soon as possible for further care. °SEEK MEDICAL CARE IF: °The exam and treatment you received today has been provided on an emergency basis only. This is not a substitute for complete medical or dental care. If your problem worsens or new problems (symptoms) appear, and you are unable to meet with your dentist, call or return to this location. °SEEK IMMEDIATE MEDICAL CARE IF:  °· You have a fever. °· You develop redness and swelling of your face, jaw, or neck. °· You are unable to open your mouth. °· You have severe pain uncontrolled by pain medicine. °MAKE SURE YOU:  °· Understand these instructions. °· Will watch your condition. °· Will get help right away if you are not doing well or get worse. °Document Released: 07/09/2005 Document Revised: 10/01/2011 Document Reviewed: 02/25/2008 °ExitCare® Patient Information ©2015 [employer]. This information is not intended to replace advice given to you by your health care provider. Make sure you discuss any questions you have with your health care provider. ° ° °RESOURCE GUIDE ° °Chronic Pain Problems: °Contact [facility]  297-2271 °Patients need to be  referred by their primary care doctor. ° °Insufficient Money for Medicine: °Contact United Way:  call "211" or [facility] 271-5999. ° °No Primary Care Doctor: °Call [facility]  832-8000 - can help you locate a primary care doctor that  accepts your insurance, provides certain services, etc. °Physician Referral Service- 1-800-533-3463 ° °Agencies that provide inexpensive medical care: °[facility]  832-8035 °[facility]  832-7272 °[facility]  [phone] °Women[facility] Clinic  832-4777 °Planned Parenthood  373-0678 °[facility]  272-1050 ° °Medicaid-accepting [place] Providers: °[facility]- [address] ° [phone], Mon-Fri 9am-7pm, Sat 9am-1pm °[facility]- [address], [address] ° [phone] °[facility]- [address] ° [phone] °[facility]- [address] [place] [address] ° [phone] °[facility]- [address], [address], [phone] ° Only accepts [place] Access Medicaid patients after they have their name  applied to their card ° °Self Pay (no insurance) in [place]: °Sickle Cell Patients: Dr Eric Dean, [facility] ° [address], [phone] °[facility]- [address] ° [phone] °      -     [facility]- [address] °      -     [facility]- see information above (Speak to Pam H if you do not have insurance) °      -  [facility]- [address], [phone] °      -  [facility] [place]- [address],  [phone] °      -  [facility]- [address] [place] [address], [phone] °      -  Dr Osei-Bonsu-  [address], [place], [phone] °      -  [facility]- [address], [phone] °      -  [facility]- [address] [place] [address], [phone], also [address], [phone] °      -     [facility]- [address], [phone], 1st & 3rd Saturday   every month, 10am-1pm ° °1) Find a Doctor and Pay Out of Pocket °Although you won't have to find out who is covered by your insurance plan, it is a good idea to ask around and get recommendations. You will then need to call the office and see if the doctor you have chosen will accept you as a new patient and what types of options they offer for patients who are self-pay. Some doctors offer discounts or will set up payment plans for their patients who do not have insurance, but you will need to ask so you aren't surprised when you get to your appointment. ° °2) Contact Your Local Health Department °Not all health departments have doctors that can see patients for sick visits, but many do, so it is worth a call to see if yours does. If you don't know where your local health department is, you can check in your phone book. The CDC also has a tool to help you locate your state's health department, and many state websites also have listings of all of their local health departments. ° °3) Find a Walk-in Clinic °If your illness is not likely to be very severe or complicated, you may want to try a walk in clinic. These are popping up all over the country in pharmacies, drugstores, and shopping centers. They're usually staffed by nurse practitioners or physician assistants that have been trained to treat common illnesses and complaints. They're usually fairly quick and inexpensive. However, if you have serious medical issues or chronic medical problems, these are probably not your best option ° °STD Testing °[place] [facility] [place], [facility], [address], [place], phone [phone] or 1-877-539-9860.  Monday - Friday, call for an appointment. °[place] [facility] [place], [facility], [address], [place], phone [phone] or 1-877-539-9860.  Monday - Friday, call for an  appointment. ° °Abuse/Neglect: °[place] Child Abuse Hotline (336) 641-3795 °[place] Child Abuse Hotline 800-378-5315 (After Hours) ° °Emergency Shelter:  [facility] (336) 271-5985 ° °Maternity Homes: °[facility] (336) 275-9566 °[facility] (704) 372-4663 ° °MRSA Hotline #:   832-7006 ° °[place] Resources ° °[facility] [place]  [employer] [place] [facility] °[address]                 [address]         [address]  °[place]                                               [place]                                [place] °Phone:  349-3220                                  Phone:  [phone]                   Phone:  342-8140 ° °[facility], 342-8316 °[place] [facility] - [facility]- 1-888-581-9988 °      -     [facility] in [place], [address],                                  336-349-4454, Insurance ° °[place] Child Abuse Hotline °(336) 342-1394 or (336) 342-3537 (After Hours) ° ° °Behavioral Health Services ° °Substance Abuse Resources: °[facility]  336-882-2125 °[facility] 336-784-9470 °[facility] 336-285-9073 °[facility] 336-845-3988 °Residential & Outpatient Substance Abuse Program  800-659-3381 ° °Psychological Services: °[facility]  832-9600 °[facility]  378-7881 °[facility], [address], [place], ACCESS LINE: 1-800-853-5163 or 336-641-4981, Http://[email or web address].htm ° °Dental Assistance ° °If unable to pay or uninsured, contact:  [facility] or [facility] to become qualified for the adult dental clinic. ° °Patients with Medicaid: [facility] °[address], 632-0744 °[address], [phone] ° °If unable to pay, or uninsured, contact  [facility] (271-5999) or [place] [facility] (641-3152 in [place], 842-7733 in [place]) to become qualified for the adult dental clinic ° °Other Low-Cost Community Dental Services: °[facility]- [address], [place], [place], [address], 723-1848, [address]. [address], 2nd and 4th Thursday of the month at 6:30am.  10 clients each day by appointment, can sometimes see walk-in patients if someone does not show for an appointment. °[facility]- [address], [place], [place], [address], 723-7904 °[facility]- [address], [place], [place], [address], 631-2330 °[facility]- 342-8273 °[facility]- 703-3100 °[facility]- 570-6415 ° °Please make every effort to establish with a primary care physician for routine medical care ° °Adult Health Services  °The [place] [facility] provides a wide range of adult health services. Some of these services are designed to address the healthcare needs of all [place] residents and all services are designed to meet the needs of uninsured/underinsured low income residents. Some services are available to any resident of [place], call [phone] for details. °] °[facility], a new medical clinic for adults, is now open. For more information about the Center and its services please call [phone]. °For information on our [facility] services, click here. ° °For more information on any of the following Department of Public Health programs, including hours of service, click on the highlighted link. ° °SERVICES FOR WOMEN (Adults and Teens) °Family Planning Services provide a full range of birth control options plus education and counseling. New patient visit and annual return visits include a complete examination, pap test as indicated, and other laboratory as indicated. Included is our Regional Vasectomy Program  for men. ° °Maternity Care is provided through pregnancy, including a six week post partum exam. Women who meet eligibility criteria for the Medicaid for Pregnant Women program, receive care free. Other women are charged on a sliding scale according to income. °Note: Our   Dental Clinic provides services to pregnant women who have a Medicaid card. Call [phone] for an appointment in [place] or [phone] for an appointment in [place]. ° °Primary Care for Medicaid [place] Access Women is available through the [place] [facility]. As primary care provider for the [place] Medicaid [place] Access Medicaid Managed Care program, women may designate the Women’s Health clinic as their primary care provider. ° °PLEASE CALL [phone] FOR AN APPOINTMENT FOR THE ABOVE SERVICES IN EITHER [place] OR [place]. Information available in English and Spanish.  ° °Childbirth Education Classes are open to the public and offered to help families prepare for the best possible childbirth experience as well as to promote lifelong health and wellness. Classes are offered throughout the year and meet on the same night once a week for five weeks. Medicaid covers the cost of the classes for the mother-to-be and her partner. For participants without Medicaid, the cost of the class series is $45.00 for the mother-to-be and her partner. Class size is limited and registration is required. For more information or to register call 336-641-4718. Baby items donated by [employer] and the [employer] are given away during each class series. ° °SERVICES FOR WOMEN AND MEN °Sexually Transmitted Infection appointments, including HIV testing, are available daily (weekdays, except holidays). Call early as same-day appointments are limited. For an appointment in either [place] or [place], call [phone]. Services are confidential and free of charge. ° °Skin Testing for Tuberculosis Please call  [phone]. °Adult Immunizations are available, usually for a fee. Please call [phone] for details. ° °PLEASE CALL [phone] FOR AN APPOINTMENT FOR THE ABOVE SERVICES IN EITHER [place] OR [place].  ° °[facility] provides up to the minute recommended vaccines for your travel destination. We also provide essential health and political information to help insure a safe and pleasurable travel experience. This program is self-sustaining, however, fees are very competitive. We are a CERTIFIED YELLOW FEVER IMMUNIZATION approved clinic site. °PLEASE CALL [phone] FOR AN APPOINTMENT IN EITHER [place] OR [place].  ° °If you have questions about the services listed above, we want to answer them! Email us at: [email or web address] °Home Visiting Services for elderly and the disabled are available to residents of [place] who are in need of care that compares to the care offered by a nursing home, have needs that can be met by the program, and have [place] Medicaid. Other short term services are available to residents 18 years and older who are unable to meet requirements for eligibility to receive services from a certified home health agency, spend the majority of time at home, and need care for six months or less. ° °PLEASE CALL [phone] OR [phone] FOR MORE INFORMATION. °Medication Assistance Program serves as a link between pharmaceutical companies and patients to provide low cost or free prescription medications. This servce is available for residents who meet certain income restrictions and have no insurance coverage. ° °PLEASE CALL [phone] ([place]) OR [phone] ([place]) FOR MORE INFORMATION.  °Updated Feb. 21, 2013 ° ° °

## 2014-02-19 NOTE — ED Provider Notes (Signed)
CSN: [id number]     Arrival date & time 02/19/14  1839 History   First MD Initiated Contact with Patient 02/19/14 1850     Chief Complaint  Patient presents with  . Dental Pain     (Consider location/radiation/quality/duration/timing/severity/associated sxs/prior Treatment) HPI  Patient presents to the emergency department with a dental complaint. Symptoms began on and off for the past year, worse over the past two days an unbearable today. The patient has tried to alleviate pain with OTC medications. Seen in April for the same but told her needs an oral surgeon and he is unable to afford that right now.  Pain rated at a 10/10, characterized as throbbing in nature and located right lower molar. Patient denies fever, night sweats, chills, difficulty swallowing or opening mouth, SOB, nuchal rigidity or decreased ROM of neck.  Patient does not have a dentist and requests a resource guide at discharge.   No past medical history on file. No past surgical history on file. No family history on file. History  Substance Use Topics  . Smoking status: Current Every Day Smoker -- 0.50 packs/day    Types: Cigarettes  . Smokeless tobacco: Not on file  . Alcohol Use: No    Review of Systems   Review of Systems  Gen: no weight loss, fevers, chills, night sweats  Eyes: no discharge or drainage, no occular pain or visual changes  Nose: no epistaxis or rhinorrhea  Mouth: + dental pain, no sore throat  Neck: no neck pain  Lungs:No wheezing or hemoptysis No coughing CV:  No palpitations, dependent edema or orthopnea. No chest pain Abd: no diarrhea. No nausea or vomiting, No abdominal pain  GU: no dysuria or gross hematuria  MSK:  No muscle weakness, No  pain Neuro: no headache, no focal neurologic deficits  Skin: no rash , no wounds Psyche: no complaints     Allergies  Review of patient's allergies indicates no known allergies.  Home Medications   Prior to Admission medications     Medication Sig Start Date End Date Taking? Authorizing Provider  amoxicillin (AMOXIL) 500 MG capsule Take 1 capsule (500 mg total) by mouth 3 (three) times daily. 02/19/14   Talena Neira Irine Seal, PA-C  cyclobenzaprine (FLEXERIL) 10 MG tablet Take 1 tablet (10 mg total) by mouth 2 (two) times daily as needed for muscle spasms. 07/24/12   Jimmye Norman, NP  diazepam (VALIUM) 5 MG tablet Take 1 tablet (5 mg total) by mouth every 6 (six) hours as needed (muscle spasms). 01/23/13   Nicole Pisciotta, PA-C  HYDROcodone-acetaminophen (NORCO/VICODIN) 5-325 MG per tablet Take 2 tablets by mouth every 4 (four) hours as needed for pain. 05/19/13   Hannah Muthersbaugh, PA-C  HYDROcodone-acetaminophen (NORCO/VICODIN) 5-325 MG per tablet Take 1-2 tablets by mouth every 4 (four) hours as needed. 02/19/14   Dorthula Matas, PA-C  penicillin v potassium (VEETID) 500 MG tablet Take 1 tablet (500 mg total) by mouth 3 (three) times daily. 05/19/13   Hannah Muthersbaugh, PA-C  penicillin v potassium (VEETID) 500 MG tablet Take 1 tablet (500 mg total) by mouth 4 (four) times daily. 11/05/13   Kaitlyn Szekalski, PA-C  traMADol (ULTRAM) 50 MG tablet Take 1 tablet (50 mg total) by mouth every 6 (six) hours as needed. 11/05/13   Kaitlyn Szekalski, PA-C   BP 136/86  Pulse 62  Temp(Src) 98.3 F (36.8 C) (Oral)  Resp 16  SpO2 100% Physical Exam  Nursing note and vitals reviewed. Constitutional: He appears  well-developed and well-nourished.  HENT:  Head: Normocephalic and atraumatic.  Mouth/Throat: Dental caries present.    Finger sweep under tongue shows no swelling or abscess.  Eyes: Conjunctivae and EOM are normal. Pupils are equal, round, and reactive to light.  Neck: Normal range of motion. Neck supple.  Cardiovascular: Normal rate and regular rhythm.   Pulmonary/Chest: Effort normal and breath sounds normal.    ED Course  Procedures (including critical care time) Labs Review Labs Reviewed - No data to  display  Imaging Review No results found.   EKG Interpretation None      MDM   Final diagnoses:  Toothache   NERVE BLOCK Date/Time: 02/19/2014 Performed by: Dorthula Matas Authorized by: Dorthula Matas Consent: Verbal consent obtained. Risks and benefits: risks, benefits and alternatives were discussed Consent given by: patient Indications: pain relief Body area: face/mouth Laterality: left Needle gauge: 25 G Local anesthetic: lidocaine 2% without epinephrine Anesthetic total: 2 ml Outcome: pain improved Patient tolerance: Patient tolerated the procedure well with no immediate complications. Comments: Patient had complete relief of pain.  Patient has dental pain. No emergent s/sx's present. Patent airway. No trismus.  Will be given pain medication and antibiotics. I discussed the need to call dentist within 24/48 hours for follow-up. Dental referral given. Return to ED precautions given.  Pt voiced understanding and has agreed to follow-up.   28 y.o.[place] S Trnka's evaluation in the Emergency Department is complete. It has been determined that no acute conditions requiring further emergency intervention are present at this time. The patient/guardian have been advised of the diagnosis and plan. We have discussed signs and symptoms that warrant return to the ED, such as changes or worsening in symptoms.  Vital signs are stable at discharge. Filed Vitals:   02/19/14 1854  BP: 136/86  Pulse: 62  Temp: 98.3 F (36.8 C)  Resp: 16    Patient/guardian has voiced understanding and agreed to follow-up with the PCP or specialist.       Dorthula Matas, PA-C 02/19/14 1930

## 2014-02-19 NOTE — ED Notes (Signed)
Pt reports upper and lower dental pain on L side. Pt reports foul taste are area of pain. Decay noted on upper and lower molars.

## 2014-08-09 ENCOUNTER — Emergency Department (HOSPITAL_COMMUNITY)
Admission: EM | Admit: 2014-08-09 | Discharge: 2014-08-09 | Disposition: A | Discharge status: Home / Self Care | Payer: No Typology Code available for payment source | Attending: Emergency Medicine | Admitting: Emergency Medicine

## 2014-08-09 ENCOUNTER — Encounter (HOSPITAL_COMMUNITY): Payer: Self-pay | Admitting: Emergency Medicine

## 2014-08-09 DIAGNOSIS — Z792 Long term (current) use of antibiotics: Secondary | ICD-10-CM | POA: Insufficient documentation

## 2014-08-09 DIAGNOSIS — R1013 Epigastric pain: Secondary | ICD-10-CM | POA: Insufficient documentation

## 2014-08-09 DIAGNOSIS — Z720 Tobacco use: Secondary | ICD-10-CM | POA: Insufficient documentation

## 2014-08-09 DIAGNOSIS — R110 Nausea: Secondary | ICD-10-CM | POA: Insufficient documentation

## 2014-08-09 LAB — CBC WITH DIFFERENTIAL/PLATELET
BASOS PCT: 1 % (ref 0–1)
Basophils Absolute: 0 10*3/uL (ref 0.0–0.1)
EOS PCT: 3 % (ref 0–5)
Eosinophils Absolute: 0.1 10*3/uL (ref 0.0–0.7)
HEMATOCRIT: 41.9 % (ref 39.0–52.0)
Hemoglobin: 14.6 g/dL (ref 13.0–17.0)
LYMPHS PCT: 53 % — AB (ref 12–46)
Lymphs Abs: 2.3 10*3/uL (ref 0.7–4.0)
MCH: 31 pg (ref 26.0–34.0)
MCHC: 34.8 g/dL (ref 30.0–36.0)
MCV: 89 fL (ref 78.0–100.0)
MONOS PCT: 8 % (ref 3–12)
Monocytes Absolute: 0.3 10*3/uL (ref 0.1–1.0)
NEUTROS PCT: 37 % — AB (ref 43–77)
Neutro Abs: 1.6 10*3/uL — ABNORMAL LOW (ref 1.7–7.7)
Platelets: 214 10*3/uL (ref 150–400)
RBC: 4.71 MIL/uL (ref 4.22–5.81)
RDW: 12.5 % (ref 11.5–15.5)
WBC: 4.4 10*3/uL (ref 4.0–10.5)

## 2014-08-09 LAB — COMPREHENSIVE METABOLIC PANEL
ALBUMIN: 3.9 g/dL (ref 3.5–5.2)
ALK PHOS: 35 U/L — AB (ref 39–117)
ALT: 14 U/L (ref 0–53)
AST: 20 U/L (ref 0–37)
Anion gap: 7 (ref 5–15)
BILIRUBIN TOTAL: 0.9 mg/dL (ref 0.3–1.2)
BUN: 15 mg/dL (ref 6–23)
CHLORIDE: 103 meq/L (ref 96–112)
CO2: 28 mmol/L (ref 19–32)
CREATININE: 1.05 mg/dL (ref 0.50–1.35)
Calcium: 9 mg/dL (ref 8.4–10.5)
GLUCOSE: 97 mg/dL (ref 70–99)
Potassium: 3.8 mmol/L (ref 3.5–5.1)
Sodium: 138 mmol/L (ref 135–145)
Total Protein: 6.3 g/dL (ref 6.0–8.3)

## 2014-08-09 LAB — LIPASE, BLOOD: Lipase: 26 U/L (ref 11–59)

## 2014-08-09 LAB — I-STAT CG4 LACTIC ACID, ED: Lactic Acid, Venous: 0.6 mmol/L (ref 0.5–2.2)

## 2014-08-09 MED ORDER — ONDANSETRON HCL 4 MG/2ML IJ SOLN
4.0000 mg | Freq: Once | INTRAMUSCULAR | Status: AC
  Administered 2014-08-09: 4 mg via INTRAVENOUS
  Filled 2014-08-09: qty 2

## 2014-08-09 MED ORDER — ALUM & MAG HYDROXIDE-SIMETH 200-200-20 MG/5ML PO SUSP
30.0000 mL | Freq: Once | ORAL | Status: AC
  Administered 2014-08-09: 30 mL via ORAL
  Filled 2014-08-09: qty 30

## 2014-08-09 MED ORDER — RANITIDINE HCL 150 MG PO CAPS: 300.0000 mg | ORAL_CAPSULE | Freq: Every day | ORAL | Status: DC

## 2014-08-09 MED ORDER — RANITIDINE HCL 150 MG/10ML PO SYRP
300.0000 mg | ORAL_SOLUTION | Freq: Once | ORAL | Status: AC
  Administered 2014-08-09: 300 mg via ORAL
  Filled 2014-08-09: qty 20

## 2014-08-09 MED ORDER — SODIUM CHLORIDE 0.9 % IV BOLUS (SEPSIS)
1000.0000 mL | Freq: Once | INTRAVENOUS | Status: AC
  Administered 2014-08-09: 1000 mL via INTRAVENOUS

## 2014-08-09 NOTE — Discharge Instructions (Signed)
Abdominal Pain Ralph Clark, you were seen today for abdominal pain. Take ranitidine as prescribed and follow-up with a GI doctor for continued management. If symptoms worsen come back to emergency department immediately. Thank you. Many things can cause belly (abdominal) pain. Most times, the belly pain is not dangerous. Many cases of belly pain can be watched and treated at home. HOME CARE   Do not take medicines that help you go poop (laxatives) unless told to by your doctor.  Only take medicine as told by your doctor.  Eat or drink as told by your doctor. Your doctor will tell you if you should be on a special diet. GET HELP IF:  You do not know what is causing your belly pain.  You have belly pain while you are sick to your stomach (nauseous) or have runny poop (diarrhea).  You have pain while you pee or poop.  Your belly pain wakes you up at night.  You have belly pain that gets worse or better when you eat.  You have belly pain that gets worse when you eat fatty foods.  You have a fever. GET HELP RIGHT AWAY IF:   The pain does not go away within 2 hours.  You keep throwing up (vomiting).  The pain changes and is only in the right or left part of the belly.  You have bloody or tarry looking poop. MAKE SURE YOU:   Understand these instructions.  Will watch your condition.  Will get help right away if you are not doing well or get worse. Document Released: 12/26/2007 Document Revised: 07/14/2013 Document Reviewed: 03/18/2013 [facility] Patient Information 2015 [employer] [place]. This information is not intended to replace advice given to you by your health care provider. Make sure you discuss any questions you have with your health care provider.

## 2014-08-09 NOTE — ED Provider Notes (Signed)
CSN: [id number]     Arrival date & time 08/09/14  0123 History  This chart was scribe for Tomasita CrumbleAdeleke Keturah Yerby, MD by Angelene GiovanniEmmanuella Mensah, ED Scribe. The patient was seen in room A05C/A05C and the patient's care was started at 1:35 AM.     No chief complaint on file.  The history is provided by the patient. No language interpreter was used.   HPI Comments: Ralph Clark is a 29 y.o. male with a family hx of ulcers who presents to the Emergency Department complaining of intermittent vomiting of bright red blood onset about 6 months. He states that his vomiting has become more frequent in the past few months. He reports associated nausea and aching abdominal pain, midepigastric. He states that he has lost unexpected weight since onset due to not eating. He denies blood in stool, SOB, dizziness, and lightheadedness. He reports that drinking anything other than water or greasy fast food makes his symptoms worse. He denies taking any medication. He denies ever being seen by a doctor for these symptoms. He denies any previous surgeries.   No past medical history on file. No past surgical history on file. No family history on file. History  Substance Use Topics  . Smoking status: Current Every Day Smoker -- 0.50 packs/day    Types: Cigarettes  . Smokeless tobacco: Not on file  . Alcohol Use: No    Review of Systems    Allergies  Review of patient's allergies indicates no known allergies.  Home Medications   Prior to Admission medications   Medication Sig Start Date End Date Taking? Authorizing Provider  amoxicillin (AMOXIL) 500 MG capsule Take 1 capsule (500 mg total) by mouth 3 (three) times daily. 02/19/14   Tiffany Irine SealG Greene, PA-C  cyclobenzaprine (FLEXERIL) 10 MG tablet Take 1 tablet (10 mg total) by mouth 2 (two) times daily as needed for muscle spasms. 07/24/12   Jimmye Normanavid John Smith, NP  diazepam (VALIUM) 5 MG tablet Take 1 tablet (5 mg total) by mouth every 6 (six) hours as needed (muscle spasms).  01/23/13   Nicole Pisciotta, PA-C  HYDROcodone-acetaminophen (NORCO/VICODIN) 5-325 MG per tablet Take 2 tablets by mouth every 4 (four) hours as needed for pain. 05/19/13   Hannah Muthersbaugh, PA-C  HYDROcodone-acetaminophen (NORCO/VICODIN) 5-325 MG per tablet Take 1-2 tablets by mouth every 4 (four) hours as needed. 02/19/14   Dorthula Matasiffany G Greene, PA-C  penicillin v potassium (VEETID) 500 MG tablet Take 1 tablet (500 mg total) by mouth 3 (three) times daily. 05/19/13   Hannah Muthersbaugh, PA-C  penicillin v potassium (VEETID) 500 MG tablet Take 1 tablet (500 mg total) by mouth 4 (four) times daily. 11/05/13   Kaitlyn Szekalski, PA-C  traMADol (ULTRAM) 50 MG tablet Take 1 tablet (50 mg total) by mouth every 6 (six) hours as needed. 11/05/13   Kaitlyn Szekalski, PA-C   There were no vitals taken for this visit. Physical Exam  Constitutional: He is oriented to person, place, and time. Vital signs are normal. He appears well-developed and well-nourished.  Non-toxic appearance. He does not appear ill. No distress.  HENT:  Head: Normocephalic and atraumatic.  Nose: Nose normal.  Mouth/Throat: Oropharynx is clear and moist. No oropharyngeal exudate.  Eyes: Conjunctivae and EOM are normal. Pupils are equal, round, and reactive to light. No scleral icterus.  Neck: Normal range of motion. Neck supple. No tracheal deviation, no edema, no erythema and normal range of motion present. No thyroid mass and no thyromegaly present.  Cardiovascular: Normal  rate, regular rhythm, S1 normal, S2 normal, normal heart sounds, intact distal pulses and normal pulses.  Exam reveals no gallop and no friction rub.   No murmur heard. Pulses:      Radial pulses are 2+ on the right side, and 2+ on the left side.       Dorsalis pedis pulses are 2+ on the right side, and 2+ on the left side.  Pulmonary/Chest: Effort normal and breath sounds normal. No respiratory distress. He has no wheezes. He has no rhonchi. He has no rales.   Abdominal: Soft. Normal appearance and bowel sounds are normal. He exhibits no distension, no ascites and no mass. There is no hepatosplenomegaly. There is no tenderness. There is no rebound, no guarding and no CVA tenderness.  Musculoskeletal: Normal range of motion. He exhibits no edema or tenderness.  Lymphadenopathy:    He has no cervical adenopathy.  Neurological: He is alert and oriented to person, place, and time. He has normal strength. No cranial nerve deficit or sensory deficit.  Skin: Skin is warm, dry and intact. No petechiae and no rash noted. He is not diaphoretic. No erythema. No pallor.  Psychiatric: He has a normal mood and affect. His behavior is normal. Judgment normal.  Nursing note and vitals reviewed.   ED Course  Procedures (including critical care time) DIAGNOSTIC STUDIES: Oxygen Saturation is 99% on RA, normal by my interpretation.    COORDINATION OF CARE: 1:36 AM- Pt advised of plan for treatment and pt agrees.    Labs Review Labs Reviewed  CBC WITH DIFFERENTIAL - Abnormal; Notable for the following:    Neutrophils Relative % 37 (*)    Neutro Abs 1.6 (*)    Lymphocytes Relative 53 (*)    All other components within normal limits  COMPREHENSIVE METABOLIC PANEL - Abnormal; Notable for the following:    Alkaline Phosphatase 35 (*)    All other components within normal limits  LIPASE, BLOOD  I-STAT CG4 LACTIC ACID, ED    Imaging Review No results found.   EKG Interpretation None      MDM   Final diagnoses:  None    Patient since emergency department for midepigastric abdominal pain and hematemesis. Physical exam vital signs did not reveal that the patient is severely anemic. Will evaluate with blood work. He was given Zofran, ranitidine, Maalox for symptomatic relief. Patient has no tenderness on abdominal exam. I do not believe CT scan is warranted for any acute intra-abdominal pathology. Patient likely has a severe gastritis versus gastric or  duodenal ulcer.  Laboratory studies reveals a hemoglobin of 14 which is normal. Patient denies any episodes of hematemesis in the emergency department. Again is likely patient has a gastritis and perhaps an ulcer as this is in his family as well. He is strongly encouraged to follow-up with GI physicians for continued management. We'll place the patient on ranitidine every day for treatment. Dietary changes advised. His vital signs remain within his normal limits and he is safe for discharge.   I personally performed the services described in this documentation, which was scribed in my presence. The recorded information has been reviewed and is accurate.     Tomasita Crumble, MD 08/09/14 [phone]

## 2014-08-09 NOTE — ED Notes (Signed)
Patient reports hematemesis and abdominal pain for the past few months. Patient reports sodas make it worse. Denies dizziness, lightheadedness, SOB. Denies blood in stool.

## 2015-11-01 ENCOUNTER — Emergency Department (HOSPITAL_COMMUNITY)
Admission: EM | Admit: 2015-11-01 | Discharge: 2015-11-01 | Disposition: A | Discharge status: Home / Self Care | Payer: No Typology Code available for payment source | Attending: Emergency Medicine | Admitting: Emergency Medicine

## 2015-11-01 ENCOUNTER — Encounter (HOSPITAL_COMMUNITY): Payer: Self-pay | Admitting: Emergency Medicine

## 2015-11-01 DIAGNOSIS — F17210 Nicotine dependence, cigarettes, uncomplicated: Secondary | ICD-10-CM | POA: Insufficient documentation

## 2015-11-01 DIAGNOSIS — K0889 Other specified disorders of teeth and supporting structures: Secondary | ICD-10-CM | POA: Insufficient documentation

## 2015-11-01 DIAGNOSIS — Z79899 Other long term (current) drug therapy: Secondary | ICD-10-CM | POA: Insufficient documentation

## 2015-11-01 DIAGNOSIS — K029 Dental caries, unspecified: Secondary | ICD-10-CM | POA: Insufficient documentation

## 2015-11-01 MED ORDER — PENICILLIN V POTASSIUM 500 MG PO TABS: 500.0000 mg | ORAL_TABLET | Freq: Four times a day (QID) | ORAL | Status: AC

## 2015-11-01 MED ORDER — OXYCODONE-ACETAMINOPHEN 5-325 MG PO TABS
1.0000 | ORAL_TABLET | Freq: Once | ORAL | Status: AC
Start: 2015-11-01 — End: 2015-11-01
  Administered 2015-11-01: 1 via ORAL
  Filled 2015-11-01: qty 1

## 2015-11-01 MED ORDER — PENICILLIN V POTASSIUM 500 MG PO TABS
500.0000 mg | ORAL_TABLET | Freq: Once | ORAL | Status: AC
  Administered 2015-11-01: 500 mg via ORAL
  Filled 2015-11-01: qty 1

## 2015-11-01 NOTE — ED Provider Notes (Signed)
CSN: [id number]     Arrival date & time 11/01/15  1629 History  By signing my name below, I, Emmanuella Mensah, attest that this documentation has been prepared under the direction and in the presence of [facility], PA-C. Electronically Signed: Angelene Giovanni, ED Scribe. 11/01/2015. 6:45 PM.   Chief Complaint  Patient presents with  . Dental Pain   The history is provided by the patient. No language interpreter was used.   HPI Comments: Ralph Clark is a 30 y.o. male who presents to the Emergency Department complaining of gradually worsening intermittent right upper dental pain onset approx. 6 months ago, constant since yesterday. He reports associated swollen lymph nodes. He states that he has tried mouthwash, peroxide, salt water, goody powders with no relief. He adds that he has also tried 5 mg hydrocodone 3.5 hours ago with temporary relief. Pt denies that he has seen a dentist for this recurring dental pain. He also denies any fever, chills, trouble swallowing, sob, sore throat, or n/v.    History reviewed. No pertinent past medical history. History reviewed. No pertinent past surgical history. No family history on file. Social History  Substance Use Topics  . Smoking status: Current Every Day Smoker -- 0.50 packs/day    Types: Cigarettes  . Smokeless tobacco: None  . Alcohol Use: No    Review of Systems  Constitutional: Negative for fever and chills.  HENT: Positive for dental problem. Negative for trouble swallowing.   Gastrointestinal: Negative for nausea and vomiting.      Allergies  Review of patient's allergies indicates no known allergies.  Home Medications   Prior to Admission medications   Medication Sig Start Date End Date Taking? Authorizing Provider  amoxicillin (AMOXIL) 500 MG capsule Take 1 capsule (500 mg total) by mouth 3 (three) times daily. Patient not taking: Reported on 08/09/2014 02/19/14   Marlon Pel, PA-C  cyclobenzaprine (FLEXERIL) 10 MG  tablet Take 1 tablet (10 mg total) by mouth 2 (two) times daily as needed for muscle spasms. Patient not taking: Reported on 08/09/2014 07/24/12   Felicie Morn, NP  diazepam (VALIUM) 5 MG tablet Take 1 tablet (5 mg total) by mouth every 6 (six) hours as needed (muscle spasms). Patient not taking: Reported on 08/09/2014 01/23/13   Joni Reining Pisciotta, PA-C  HYDROcodone-acetaminophen (NORCO/VICODIN) 5-325 MG per tablet Take 2 tablets by mouth every 4 (four) hours as needed for pain. Patient not taking: Reported on 08/09/2014 05/19/13   Dahlia Client Muthersbaugh, PA-C  HYDROcodone-acetaminophen (NORCO/VICODIN) 5-325 MG per tablet Take 1-2 tablets by mouth every 4 (four) hours as needed. Patient not taking: Reported on 08/09/2014 02/19/14   Marlon Pel, PA-C  penicillin v potassium (VEETID) 500 MG tablet Take 1 tablet (500 mg total) by mouth 4 (four) times daily. 11/01/15 11/08/15  Chase Picket Tion Tse, PA-C  ranitidine (ZANTAC) 150 MG capsule Take 2 capsules (300 mg total) by mouth daily. 08/09/14   Tomasita Crumble, MD  traMADol (ULTRAM) 50 MG tablet Take 1 tablet (50 mg total) by mouth every 6 (six) hours as needed. Patient not taking: Reported on 08/09/2014 11/05/13   Kaitlyn Szekalski, PA-C   BP 124/81 mmHg  Pulse 82  Temp(Src) 98.3 F (36.8 C)  Resp 18  SpO2 100% Physical Exam  Constitutional: He is oriented to person, place, and time. He appears well-developed and well-nourished.  Alert and in no acute distress  HENT:  Head: Normocephalic and atraumatic.  Mouth/Throat:    Dental cavities and poor oral dentition noted, pain  along tooth as depicted in image, midline uvula, no trismus, oropharynx moist and clear, no abscess noted, no oropharyngeal erythema or edema, neck supple and no tenderness. No facial edema  Neck: Neck supple.  Cardiovascular: Normal rate, regular rhythm, normal heart sounds and intact distal pulses.  Exam reveals no gallop and no friction rub.   No murmur heard. Pulmonary/Chest: Effort  normal and breath sounds normal. No respiratory distress. He has no wheezes. He has no rales. He exhibits no tenderness.  Musculoskeletal: Normal range of motion.  Lymphadenopathy:    He has cervical adenopathy.  Neurological: He is alert and oriented to person, place, and time.  Skin: Skin is warm and dry.  Psychiatric: He has a normal mood and affect.  Nursing note and vitals reviewed.   ED Course  Procedures (including critical care time) DIAGNOSTIC STUDIES: Oxygen Saturation is 100% on RA, normal by my interpretation.    COORDINATION OF CARE: 6:40 PM- Pt advised of plan for treatment and pt agrees. Recommended that pt follow up with dentist. He will receive Penicillin and Percocet.  MDM   Final diagnoses:  Dentalgia   Patient with dentalgia. No abscess requiring immediate incision and drainage. Patient is afebrile, non toxic appearing, and swallowing secretions well. Exam not concerning for Ludwig's angina or pharyngeal abscess. Will treat with PenVK. I gave patient dental resource guide and stressed the importance of dental follow up for ultimate management of dental pain. Patient voices understanding and is agreeable to plan.  I personally performed the services described in this documentation, which was scribed in my presence. The recorded information has been reviewed and is accurate.  [facility] Pilcher Teresa Lemmerman, PA-C 11/01/15 2006  Donnetta HutchingBrian Cook, MD 11/01/15 2330

## 2015-11-01 NOTE — ED Notes (Signed)
Per pt, states right upper dental abscess-increased pain

## 2015-11-01 NOTE — Discharge Instructions (Signed)
You have a dental injury. It is very important that you get evaluated by a dentist as soon as possible. Call tomorrow to schedule an appointment. Take your full course of antibiotics. Read the instructions below. ° °Eat a soft or liquid diet and rinse your mouth out after meals with warm water. You should see a dentist or return here at once if you have increased swelling, increased pain or uncontrolled bleeding from the site of your injury. ° °SEEK MEDICAL CARE IF:  °· You have increased pain not controlled with medicines.  °· You have swelling around your tooth, in your face or neck.  °· You have bleeding which starts, continues, or gets worse.  °· You have a fever >101 °· If you are unable to open your mouth ° °Community Resource Guide Dental °The [employer]’s “211” is a great source of information about community services available.  Access by dialing 2-1-1 from anywhere in [place], or by website -  www.nc211.org.  ° °Other Local Resources (Updated 07/2015) ° °Dental  Care °  °Services ° °  °Phone Number and Address  °Cost  °[facility] For children 0 - 21 years of age:  °• Cleaning °• Tooth brushing/flossing instruction °• Sealants, fillings, crowns °• Extractions °• Emergency treatment  [phone] °[address] °[place], [place] [address] Charges based on family income.  Medicaid and some insurance plans accepted.   °  °[facility] [place] • Cleaning °• Sealants, fillings, crowns °• Extractions °• Emergency treatment [phone] °[address] °[place], [place] ° Pregnant women 18 years of age or older with a Medicaid card  °[facility] [place] • Cleaning °• Sealants, fillings, crowns °• Extractions °• Emergency treatment [phone] °[address] °[place], [place] Pregnant women 18 years of age or older with a Medicaid card  °[facility] For children 0 - 21 years of age:  °• Cleaning °• Tooth brushing/flossing instruction °• Sealants, fillings, crowns °• Extractions °• Emergency treatment °Limited orthodontic services for patients with Medicaid [phone] °[address] °, [place] [address] Medicaid and [place] Health Choice cover for children up to age 21 and pregnant women.  Parents of children up to age 21 without Medicaid pay a reduced fee at time of service.  °[facility] [place] For children 0 - 21 years of age:  °• Cleaning °• Tooth brushing/flossing instruction °• Sealants, fillings, crowns °• Extractions °• Emergency treatment °Limited orthodontic services for patients with Medicaid [phone] °[address] °[place], [place].  Medicaid and [place] Health Choice cover for children up to age 21 and pregnant women.  Parents of children up to age 21 without Medicaid pay a reduced fee.  °[facility] • Cleaning °• Sealants, fillings, crowns °• Extractions ° °Hours: Tuesdays and Thursdays, 4:15 - 8 pm [phone] °[address] °[place], [place] [address] Services free of charge to  County residents ages 18-64 who do not have health insurance, Medicare, Medicaid, or VA benefits and fall within federal poverty guidelines  °[facility] ° ° ° Provides dental care in addition to primary medical care, nutritional counseling, and pharmacy: °• Cleaning °• Sealants, fillings, crowns °• Extractions ° ° ° ° ° ° ° ° ° ° ° ° ° ° ° ° ° [phone] ° [facility],   [address] °[place], [place] ° °[phone] °[facility], [address] [place], [place] ° °[phone] °[facility] °[place], [place] ° °[phone] °[facility], [address] °[place], [place] ° °[phone] °[facility] °[address] °[place], [place]  Accepts Medicaid, Medicare, most insurance.  Also provides services available to all with fees adjusted based on ability to pay.    °[facility] • Cleaning °• Tooth brushing/flossing instruction °• Sealants, fillings, crowns °• Extractions °• Emergency treatment °Hours: Tuesdays, Thursdays, and Fridays from 8 am to 5 pm by appointment only. [phone] °[address] °[place], [place] [address] [place] residents with Medicaid (depending on eligibility) and children with [place] Health Choice - call for more information.  °[facility] • Extractions only ° °Hours: 2nd and 4th Thursday of each month from 6:30 am - 9 am.   [phone]. [phone] °[address] °[place], [place] [address] Ages 18 and older only.  Patients are seen on a first come, first served basis.  °[facility] • Cleanings °• Fillings °• Extractions °• Orthodontics °• Endodontics °• Implants/Crowns/Bridges °• Complete and partial dentures [phone] °[place],  Patients must complete an application for services.  There is often a waiting list.   ° °

## 2016-07-09 ENCOUNTER — Encounter (HOSPITAL_COMMUNITY): Payer: Self-pay | Admitting: *Deleted

## 2016-07-09 ENCOUNTER — Emergency Department (HOSPITAL_COMMUNITY)
Admission: EM | Admit: 2016-07-09 | Discharge: 2016-07-09 | Disposition: A | Discharge status: Home / Self Care | Payer: Self-pay | Attending: Emergency Medicine | Admitting: Emergency Medicine

## 2016-07-09 DIAGNOSIS — Z79899 Other long term (current) drug therapy: Secondary | ICD-10-CM | POA: Insufficient documentation

## 2016-07-09 DIAGNOSIS — K029 Dental caries, unspecified: Secondary | ICD-10-CM | POA: Insufficient documentation

## 2016-07-09 DIAGNOSIS — K2970 Gastritis, unspecified, without bleeding: Secondary | ICD-10-CM | POA: Insufficient documentation

## 2016-07-09 DIAGNOSIS — F17210 Nicotine dependence, cigarettes, uncomplicated: Secondary | ICD-10-CM | POA: Insufficient documentation

## 2016-07-09 HISTORY — DX: Gastritis, unspecified, without bleeding: K29.70

## 2016-07-09 LAB — CBC
HEMATOCRIT: 42.9 % (ref 39.0–52.0)
Hemoglobin: 14.9 g/dL (ref 13.0–17.0)
MCH: 31.2 pg (ref 26.0–34.0)
MCHC: 34.7 g/dL (ref 30.0–36.0)
MCV: 89.7 fL (ref 78.0–100.0)
Platelets: 202 10*3/uL (ref 150–400)
RBC: 4.78 MIL/uL (ref 4.22–5.81)
RDW: 12.5 % (ref 11.5–15.5)
WBC: 5.8 10*3/uL (ref 4.0–10.5)

## 2016-07-09 LAB — COMPREHENSIVE METABOLIC PANEL
ALBUMIN: 3.9 g/dL (ref 3.5–5.0)
ALT: 17 U/L (ref 17–63)
AST: 21 U/L (ref 15–41)
Alkaline Phosphatase: 34 U/L — ABNORMAL LOW (ref 38–126)
Anion gap: 9 (ref 5–15)
BILIRUBIN TOTAL: 2 mg/dL — AB (ref 0.3–1.2)
BUN: 10 mg/dL (ref 6–20)
CHLORIDE: 103 mmol/L (ref 101–111)
CO2: 27 mmol/L (ref 22–32)
CREATININE: 1.01 mg/dL (ref 0.61–1.24)
Calcium: 9.2 mg/dL (ref 8.9–10.3)
GFR calc Af Amer: >60 mL/min (ref >60)
Glucose, Bld: 103 mg/dL — ABNORMAL HIGH (ref 65–99)
Potassium: 3.8 mmol/L (ref 3.5–5.1)
Sodium: 139 mmol/L (ref 135–145)
Total Protein: 6.9 g/dL (ref 6.5–8.1)

## 2016-07-09 LAB — URINALYSIS, ROUTINE W REFLEX MICROSCOPIC
BACTERIA UA: NONE SEEN
Bilirubin Urine: NEGATIVE
Glucose, UA: NEGATIVE mg/dL
Hgb urine dipstick: NEGATIVE
Ketones, ur: NEGATIVE mg/dL
Nitrite: NEGATIVE
PH: 5 (ref 5.0–8.0)
Protein, ur: 30 mg/dL — AB
SPECIFIC GRAVITY, URINE: 1.031 — AB (ref 1.005–1.030)

## 2016-07-09 LAB — LIPASE, BLOOD: LIPASE: 21 U/L (ref 11–51)

## 2016-07-09 MED ORDER — GI COCKTAIL ~~LOC~~
30.0000 mL | Freq: Once | ORAL | Status: AC
  Administered 2016-07-09: 30 mL via ORAL
  Filled 2016-07-09: qty 30

## 2016-07-09 MED ORDER — PANTOPRAZOLE SODIUM 40 MG PO TBEC
40.0000 mg | DELAYED_RELEASE_TABLET | Freq: Once | ORAL | Status: AC
Start: 2016-07-09 — End: 2016-07-09
  Administered 2016-07-09: 40 mg via ORAL
  Filled 2016-07-09: qty 1

## 2016-07-09 MED ORDER — ONDANSETRON 4 MG PO TBDP
4.0000 mg | ORAL_TABLET | Freq: Once | ORAL | Status: AC
  Administered 2016-07-09: 4 mg via ORAL
  Filled 2016-07-09: qty 1

## 2016-07-09 MED ORDER — OMEPRAZOLE 40 MG PO CPDR: 40.0000 mg | DELAYED_RELEASE_CAPSULE | Freq: Every day | ORAL | 1 refills | Status: DC

## 2016-07-09 MED ORDER — PENICILLIN V POTASSIUM 500 MG PO TABS: 500.0000 mg | ORAL_TABLET | Freq: Four times a day (QID) | ORAL | 0 refills | Status: DC

## 2016-07-09 MED ORDER — SUCRALFATE 1 GM/10ML PO SUSP: 1.0000 g | Freq: Three times a day (TID) | ORAL | 0 refills | Status: DC

## 2016-07-09 MED ORDER — PROMETHAZINE HCL 25 MG PO TABS: 25.0000 mg | ORAL_TABLET | Freq: Four times a day (QID) | ORAL | 0 refills | Status: DC | PRN

## 2016-07-09 MED ORDER — ACETAMINOPHEN 500 MG PO TABS
1000.0000 mg | ORAL_TABLET | Freq: Once | ORAL | Status: AC
  Administered 2016-07-09: 1000 mg via ORAL
  Filled 2016-07-09: qty 2

## 2016-07-09 MED ORDER — PENICILLIN V POTASSIUM 250 MG PO TABS
500.0000 mg | ORAL_TABLET | Freq: Once | ORAL | Status: AC
  Administered 2016-07-09: 500 mg via ORAL
  Filled 2016-07-09: qty 2

## 2016-07-09 NOTE — ED Provider Notes (Signed)
By signing my name below, I, Nelwyn SalisburyJoshua Fowler, attest that this documentation has been prepared under the direction and in the presence of Kristen N Ward, DO . Electronically Signed: Nelwyn SalisburyJoshua Fowler, Scribe. 07/09/2016. 2:19 AM.  TIME SEEN: 2:34AM  CHIEF COMPLAINT: Abdominal Pain  HPI:  Ralph Clark is a 30 y.o. male with pmhx who presents to the Emergency Department complaining of gradual onset constant worsening periumbilical abdominal pain beginning 2 days ago. Pt describes his symptoms as a 10/10 pain exacerbated by eating or drinking. He reports associated vomiting and hematemesis. States that he sees streaks of blood in his vomit. Pt denies any diarrhea, fever, chills, melena or bloody stool. He notes that he has had these symptoms for months but today's symptoms are particularly severe. Pt has no shx on his abdomen. No history of previous endoscopy. Drinks alcohol irregularly. No NSAID use.  Pt secondarily complains of intermittent headache. He states that his pain seems to come from some of his dental pain. States he has had dental pain and multiple teeth for several months. No facial swelling. No trismus or drooling. Again no fever. Does not have a dentist.  Also complaining of mild nasal congestion and mild intermittent dry cough for the past month. Girlfriend states he has a "cold". No shortness of breath, chest pain, fever. No sick contacts or recent travel.  ROS: See HPI Constitutional: no fever. no chills Eyes: no drainage  ENT: no runny nose   Cardiovascular:  no chest pain  Resp: no SOB  GI: abdominal pain. vomiting. hematemesis. no bloody stool. No diarrhea. GU: no dysuria Integumentary: no rash  Allergy: no hives  Musculoskeletal: no leg swelling  Neurological: no slurred speech ROS otherwise negative  PAST MEDICAL HISTORY/PAST SURGICAL HISTORY:  History reviewed. No pertinent past medical history.  MEDICATIONS:  Prior to Admission medications   Medication Sig Start Date  End Date Taking? Authorizing Carmelia Tiner  amoxicillin (AMOXIL) 500 MG capsule Take 1 capsule (500 mg total) by mouth 3 (three) times daily. Patient not taking: Reported on 08/09/2014 02/19/14   Marlon Peliffany Greene, PA-C  cyclobenzaprine (FLEXERIL) 10 MG tablet Take 1 tablet (10 mg total) by mouth 2 (two) times daily as needed for muscle spasms. Patient not taking: Reported on 08/09/2014 07/24/12   Felicie Mornavid Smith, NP  diazepam (VALIUM) 5 MG tablet Take 1 tablet (5 mg total) by mouth every 6 (six) hours as needed (muscle spasms). Patient not taking: Reported on 08/09/2014 01/23/13   Joni ReiningNicole Pisciotta, PA-C  HYDROcodone-acetaminophen (NORCO/VICODIN) 5-325 MG per tablet Take 2 tablets by mouth every 4 (four) hours as needed for pain. Patient not taking: Reported on 08/09/2014 05/19/13   Dahlia ClientHannah Muthersbaugh, PA-C  HYDROcodone-acetaminophen (NORCO/VICODIN) 5-325 MG per tablet Take 1-2 tablets by mouth every 4 (four) hours as needed. Patient not taking: Reported on 08/09/2014 02/19/14   Marlon Peliffany Greene, PA-C  ranitidine (ZANTAC) 150 MG capsule Take 2 capsules (300 mg total) by mouth daily. 08/09/14   Tomasita CrumbleAdeleke Oni, MD  traMADol (ULTRAM) 50 MG tablet Take 1 tablet (50 mg total) by mouth every 6 (six) hours as needed. Patient not taking: Reported on 08/09/2014 11/05/13   Emilia BeckKaitlyn Szekalski, PA-C    ALLERGIES:  No Known Allergies  SOCIAL HISTORY:  Social History  Substance Use Topics  . Smoking status: Current Every Day Smoker    Packs/day: 0.50    Types: Cigarettes  . Smokeless tobacco: Never Used  . Alcohol use No    FAMILY HISTORY: No family history on file.  EXAM: BP 119/76   Pulse 78   Temp 98.7 F (37.1 C) (Oral)   Resp 18   SpO2 100%  CONSTITUTIONAL: Alert and oriented and responds appropriately to questions. Well-appearing; well-nourished HEAD: Normocephalic EYES: Conjunctivae clear, PERRL, EOMI ENT: normal nose; no rhinorrhea; moist mucous membranes; No pharyngeal erythema or petechiae, no tonsillar  hypertrophy or exudate, no uvular deviation, no unilateral swelling, no trismus or drooling, no muffled voice, normal phonation, no stridor, multiple dental caries present, no drainable dental abscess noted, no Ludwig's angina, tongue sits flat in the bottom of the mouth, no angioedema, no facial erythema or warmth, no facial swelling; no pain with movement of the neck NECK: Supple, no meningismus, no nuchal rigidity, no LAD  CARD: RRR; S1 and S2 appreciated; no murmurs, no clicks, no rubs, no gallops RESP: Normal chest excursion without splinting or tachypnea; breath sounds clear and equal bilaterally; no wheezes, no rhonchi, no rales, no hypoxia or respiratory distress, speaking full sentences ABD/GI: Minimally tender over epigastric region. No murphy's. Normal bowel sounds; non-distended; no rebound, no guarding, no peritoneal signs, no hepatosplenomegaly BACK:  The back appears normal and is non-tender to palpation, there is no CVA tenderness EXT: Normal ROM in all joints; non-tender to palpation; no edema; normal capillary refill; no cyanosis, no calf tenderness or swelling    SKIN: Normal color for age and race; warm; no rash NEURO: Moves all extremities equally, sensation to light touch intact diffusely, cranial nerves II through XII intact, normal speech PSYCH: The patient's mood and manner are appropriate. Grooming and personal hygiene are appropriate.  MEDICAL DECISION MAKING: Patient here with multiple different complaints. Complaining of dental pain. He has multiple dental caries without abscess. No Ludwig's angina. Will put him on prophylactic penicillin and have him take Tylenol for pain. We'll give dental follow-up.   Patient also complaining of mild nasal congestion and dry cough intermittently for the past month. Lungs are completely clear. Doubt pneumonia. He is afebrile. No chest pain or shortness of breath. Have advised him to follow-up with his primary care physician. We'll give  him a list of PCPs. I do not feel he needs any other antibiotics for this at this time.    Patient also complaining of epigastric abdominal pain. Suspect gastritis. Advised him to follow-up with GI. Recommended bland diet and that he avoid NSAIDs and alcohol. We'll discharge with omeprazole, Phenergan, Carafate. Labs ordered in triage are unremarkable. He has not had any vomiting or bleeding here. He declines rectal exam. I do not feel he needs emergent imaging. Doubt cholecystitis, pancreatitis, appendicitis, colitis or diverticulitis.   At this time, I do not feel there is any life-threatening condition present. I have reviewed and discussed all results (EKG, imaging, lab, urine as appropriate) and exam findings with patient/family. I have reviewed nursing notes and appropriate previous records.  I feel the patient is safe to be discharged home without further emergent workup and can continue workup as an outpatient as needed. Discussed usual and customary return precautions. Patient/family verbalize understanding and are comfortable with this plan.  Outpatient follow-up has been provided. All questions have been answered.   ED PROGRESS: 4:30 AM Patient reports feeling much better in the emergency department after Protonix, Zofran, GI cocktail, Tylenol and penicillin. We'll discharge home.     I personally performed the services described in this documentation, which was scribed in my presence. The recorded information has been reviewed and is accurate.     Layla MawKristen N Ward, DO  07/09/16 0426  

## 2016-07-09 NOTE — ED Triage Notes (Signed)
The pt has had  abd pain and nausea for months getting worse for the past week  He  Reports that he cannot eat and today he vomited up blood

## 2016-07-09 NOTE — ED Notes (Signed)
EDP at bedside  

## 2016-07-09 NOTE — Discharge Instructions (Signed)
You may take Tylenol 1000 mg every 6 hours as needed for pain. Please avoid aspirin, ibuprofen, Goody powders, Aleve. Please avoid alcohol. Please make appointment with a gastroenterologist.   Please schedule appointment with a dentist.    To find a primary care or specialty doctor please call [phone] or [phone] to access "[facility] Find a Doctor Service."  You may also go on the [facility] website at [email or web address]  There are also multiple [facility], [place] [facility] practices throughout the [place] that are frequently accepting new patients. You may find a clinic that is close to your home and contact them.  [facility] -  [address] [place] [phone]   [facility] -  [address] [place] [id number] [phone]   [facility] [phone]  [place] [id number] [phone]

## 2016-07-15 ENCOUNTER — Emergency Department (HOSPITAL_COMMUNITY): Payer: Self-pay

## 2016-07-15 ENCOUNTER — Emergency Department (HOSPITAL_COMMUNITY)
Admission: EM | Admit: 2016-07-15 | Discharge: 2016-07-15 | Disposition: A | Discharge status: Home / Self Care | Payer: Self-pay | Attending: Emergency Medicine | Admitting: Emergency Medicine

## 2016-07-15 ENCOUNTER — Encounter (HOSPITAL_COMMUNITY): Payer: Self-pay | Admitting: Emergency Medicine

## 2016-07-15 DIAGNOSIS — M79671 Pain in right foot: Secondary | ICD-10-CM | POA: Insufficient documentation

## 2016-07-15 DIAGNOSIS — J329 Chronic sinusitis, unspecified: Secondary | ICD-10-CM

## 2016-07-15 DIAGNOSIS — Z87891 Personal history of nicotine dependence: Secondary | ICD-10-CM | POA: Insufficient documentation

## 2016-07-15 DIAGNOSIS — R51 Headache: Secondary | ICD-10-CM | POA: Insufficient documentation

## 2016-07-15 DIAGNOSIS — Y929 Unspecified place or not applicable: Secondary | ICD-10-CM | POA: Insufficient documentation

## 2016-07-15 DIAGNOSIS — S00511A Abrasion of lip, initial encounter: Secondary | ICD-10-CM | POA: Insufficient documentation

## 2016-07-15 DIAGNOSIS — Y939 Activity, unspecified: Secondary | ICD-10-CM | POA: Insufficient documentation

## 2016-07-15 DIAGNOSIS — S62316A Displaced fracture of base of fifth metacarpal bone, right hand, initial encounter for closed fracture: Secondary | ICD-10-CM | POA: Insufficient documentation

## 2016-07-15 DIAGNOSIS — Y990 Civilian activity done for income or pay: Secondary | ICD-10-CM | POA: Insufficient documentation

## 2016-07-15 DIAGNOSIS — S62309A Unspecified fracture of unspecified metacarpal bone, initial encounter for closed fracture: Secondary | ICD-10-CM

## 2016-07-15 HISTORY — DX: Chronic sinusitis, unspecified: J32.9

## 2016-07-15 HISTORY — DX: Unspecified fracture of unspecified metacarpal bone, initial encounter for closed fracture: S62.309A

## 2016-07-15 MED ORDER — AMOXICILLIN-POT CLAVULANATE 875-125 MG PO TABS: 1.0000 | ORAL_TABLET | Freq: Two times a day (BID) | ORAL | 0 refills | Status: DC

## 2016-07-15 MED ORDER — HYDROCODONE-ACETAMINOPHEN 5-325 MG PO TABS: 1.0000 | ORAL_TABLET | Freq: Four times a day (QID) | ORAL | 0 refills | Status: DC | PRN
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
## 2016-07-15 NOTE — Discharge Instructions (Signed)
Medications: Norco, Augmentin  Treatment: Take 1-2 Norco every 4-6 hours as needed for severe pain. Do not drive or operate machinery while taking this medication.You can take ibuprofen as prescribed over-the-counter as needed for mild to moderate pain.Keep your splint clean and dry and applied until told otherwise by Dr. Mina MarbleWeingold. Fill the Augmentin prescription instead of penicillin to treat your dental infection and sinus infection. Please follow-up with a dentist as soon as possible.  Follow-up: Please follow-up with Dr. Mina MarbleWeingold by calling his office on 07/17/16 to make an appointment for further evaluation and treatment of your symptoms.Please return to emergency department if he develop any new or worsening symptoms.
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
## 2016-07-19 ENCOUNTER — Other Ambulatory Visit: Payer: Self-pay | Admitting: Orthopedic Surgery

## 2016-07-20 ENCOUNTER — Encounter (HOSPITAL_BASED_OUTPATIENT_CLINIC_OR_DEPARTMENT_OTHER): Payer: Self-pay | Admitting: *Deleted

## 2016-07-25 ENCOUNTER — Ambulatory Visit (HOSPITAL_BASED_OUTPATIENT_CLINIC_OR_DEPARTMENT_OTHER)
Admission: RE | Admit: 2016-07-25 | Discharge: 2016-07-25 | Disposition: A | Discharge status: Home / Self Care | Payer: Self-pay | Source: Ambulatory Visit | Attending: Orthopedic Surgery | Admitting: Orthopedic Surgery

## 2016-07-25 ENCOUNTER — Encounter (HOSPITAL_BASED_OUTPATIENT_CLINIC_OR_DEPARTMENT_OTHER)
Admission: RE | Disposition: A | Discharge status: Home / Self Care | Payer: Self-pay | Source: Ambulatory Visit | Attending: Orthopedic Surgery

## 2016-07-25 ENCOUNTER — Ambulatory Visit (HOSPITAL_BASED_OUTPATIENT_CLINIC_OR_DEPARTMENT_OTHER): Payer: Self-pay | Admitting: Certified Registered"

## 2016-07-25 ENCOUNTER — Encounter (HOSPITAL_BASED_OUTPATIENT_CLINIC_OR_DEPARTMENT_OTHER): Payer: Self-pay | Admitting: Certified Registered"

## 2016-07-25 DIAGNOSIS — X58XXXA Exposure to other specified factors, initial encounter: Secondary | ICD-10-CM | POA: Insufficient documentation

## 2016-07-25 DIAGNOSIS — Z87891 Personal history of nicotine dependence: Secondary | ICD-10-CM | POA: Insufficient documentation

## 2016-07-25 DIAGNOSIS — S62606A Fracture of unspecified phalanx of right little finger, initial encounter for closed fracture: Secondary | ICD-10-CM | POA: Insufficient documentation

## 2016-07-25 HISTORY — DX: Unspecified fracture of unspecified metacarpal bone, initial encounter for closed fracture: S62.309A

## 2016-07-25 HISTORY — DX: Chronic sinusitis, unspecified: J32.9

## 2016-07-25 HISTORY — PX: CLOSED REDUCTION METACARPAL WITH PERCUTANEOUS PINNING: SHX5613

## 2016-07-25 HISTORY — DX: Gastritis, unspecified, without bleeding: K29.70

## 2016-07-25 SURGERY — CLOSED REDUCTION, FRACTURE, METACARPAL BONE, WITH PERCUTANEOUS PINNING
Anesthesia: General | Site: Hand | Laterality: Right

## 2016-07-25 MED ORDER — FENTANYL CITRATE (PF) 100 MCG/2ML IJ SOLN
INTRAMUSCULAR | Status: AC
  Filled 2016-07-25: qty 2

## 2016-07-25 MED ORDER — ONDANSETRON HCL 4 MG/2ML IJ SOLN
INTRAMUSCULAR | Status: AC
  Filled 2016-07-25: qty 2

## 2016-07-25 MED ORDER — ONDANSETRON HCL 4 MG/2ML IJ SOLN
INTRAMUSCULAR | Status: DC | PRN
  Administered 2016-07-25: 4 mg via INTRAVENOUS

## 2016-07-25 MED ORDER — OXYCODONE HCL 5 MG PO TABS
5.0000 mg | ORAL_TABLET | Freq: Once | ORAL | Status: AC | PRN
  Administered 2016-07-25: 5 mg via ORAL

## 2016-07-25 MED ORDER — SUCCINYLCHOLINE 20MG/ML (10ML) SYRINGE FOR MEDFUSION PUMP - OPTIME
INTRAMUSCULAR | Status: DC | PRN
  Administered 2016-07-25: 120 mg via INTRAVENOUS

## 2016-07-25 MED ORDER — CHLORHEXIDINE GLUCONATE 4 % EX LIQD: 60.0000 mL | Freq: Once | CUTANEOUS | Status: DC

## 2016-07-25 MED ORDER — HYDROMORPHONE HCL 1 MG/ML IJ SOLN
0.2500 mg | INTRAMUSCULAR | Status: DC | PRN
  Administered 2016-07-25 (×2): 0.5 mg via INTRAVENOUS

## 2016-07-25 MED ORDER — LIDOCAINE 2% (20 MG/ML) 5 ML SYRINGE
INTRAMUSCULAR | Status: AC
  Filled 2016-07-25: qty 5

## 2016-07-25 MED ORDER — CEFAZOLIN SODIUM-DEXTROSE 2-4 GM/100ML-% IV SOLN
2.0000 g | INTRAVENOUS | Status: AC
  Administered 2016-07-25: 2 g via INTRAVENOUS

## 2016-07-25 MED ORDER — OXYCODONE HCL 5 MG/5ML PO SOLN: 5.0000 mg | Freq: Once | ORAL | Status: AC | PRN

## 2016-07-25 MED ORDER — LACTATED RINGERS IV SOLN
INTRAVENOUS | Status: DC
  Administered 2016-07-25 (×2): via INTRAVENOUS

## 2016-07-25 MED ORDER — MEPERIDINE HCL 25 MG/ML IJ SOLN: 6.2500 mg | INTRAMUSCULAR | Status: DC | PRN

## 2016-07-25 MED ORDER — OXYCODONE HCL 5 MG PO TABS
ORAL_TABLET | ORAL | Status: AC
  Filled 2016-07-25: qty 1

## 2016-07-25 MED ORDER — PROPOFOL 10 MG/ML IV BOLUS
INTRAVENOUS | Status: AC
  Filled 2016-07-25: qty 20

## 2016-07-25 MED ORDER — LIDOCAINE HCL (CARDIAC) 20 MG/ML IV SOLN
INTRAVENOUS | Status: DC | PRN
  Administered 2016-07-25: 60 mg via INTRAVENOUS

## 2016-07-25 MED ORDER — CEFAZOLIN SODIUM-DEXTROSE 2-4 GM/100ML-% IV SOLN
INTRAVENOUS | Status: AC
  Filled 2016-07-25: qty 100

## 2016-07-25 MED ORDER — DEXAMETHASONE SODIUM PHOSPHATE 10 MG/ML IJ SOLN
INTRAMUSCULAR | Status: DC | PRN
  Administered 2016-07-25: 10 mg via INTRAVENOUS

## 2016-07-25 MED ORDER — DEXAMETHASONE SODIUM PHOSPHATE 10 MG/ML IJ SOLN
INTRAMUSCULAR | Status: AC
  Filled 2016-07-25: qty 2

## 2016-07-25 MED ORDER — FENTANYL CITRATE (PF) 100 MCG/2ML IJ SOLN
50.0000 ug | INTRAMUSCULAR | Status: DC | PRN
  Administered 2016-07-25: 50 ug via INTRAVENOUS
  Administered 2016-07-25: 100 ug via INTRAVENOUS

## 2016-07-25 MED ORDER — OXYCODONE-ACETAMINOPHEN 5-325 MG PO TABS: 1.0000 | ORAL_TABLET | ORAL | 0 refills | Status: DC | PRN

## 2016-07-25 MED ORDER — LACTATED RINGERS IV SOLN: INTRAVENOUS | Status: DC

## 2016-07-25 MED ORDER — BUPIVACAINE HCL (PF) 0.25 % IJ SOLN
INTRAMUSCULAR | Status: DC | PRN
  Administered 2016-07-25: 7 mL

## 2016-07-25 MED ORDER — HYDROMORPHONE HCL 1 MG/ML IJ SOLN
INTRAMUSCULAR | Status: AC
  Filled 2016-07-25: qty 1

## 2016-07-25 MED ORDER — PROMETHAZINE HCL 25 MG/ML IJ SOLN: 6.2500 mg | INTRAMUSCULAR | Status: DC | PRN

## 2016-07-25 MED ORDER — ONDANSETRON HCL 4 MG/2ML IJ SOLN
INTRAMUSCULAR | Status: AC
  Filled 2016-07-25: qty 8

## 2016-07-25 MED ORDER — SCOPOLAMINE 1 MG/3DAYS TD PT72: 1.0000 | MEDICATED_PATCH | Freq: Once | TRANSDERMAL | Status: DC | PRN

## 2016-07-25 MED ORDER — DEXAMETHASONE SODIUM PHOSPHATE 10 MG/ML IJ SOLN
INTRAMUSCULAR | Status: AC
  Filled 2016-07-25: qty 1

## 2016-07-25 MED ORDER — MIDAZOLAM HCL 2 MG/2ML IJ SOLN
1.0000 mg | INTRAMUSCULAR | Status: DC | PRN
  Administered 2016-07-25: 2 mg via INTRAVENOUS

## 2016-07-25 MED ORDER — PROPOFOL 10 MG/ML IV BOLUS
INTRAVENOUS | Status: DC | PRN
Start: 2016-07-25 — End: 2016-07-25
  Administered 2016-07-25: 150 mg via INTRAVENOUS

## 2016-07-25 MED ORDER — MIDAZOLAM HCL 2 MG/2ML IJ SOLN
INTRAMUSCULAR | Status: AC
  Filled 2016-07-25: qty 2

## 2016-07-25 SURGICAL SUPPLY — 66 items
APL SKNCLS STERI-STRIP NONHPOA (GAUZE/BANDAGES/DRESSINGS)
BANDAGE ACE 3X5.8 VEL STRL LF (GAUZE/BANDAGES/DRESSINGS) ×3 IMPLANT
BANDAGE ACE 4X5 VEL STRL LF (GAUZE/BANDAGES/DRESSINGS) IMPLANT
BENZOIN TINCTURE PRP APPL 2/3 (GAUZE/BANDAGES/DRESSINGS) IMPLANT
BLADE SURG 15 STRL LF DISP TIS (BLADE) ×1 IMPLANT
BLADE SURG 15 STRL SS (BLADE) ×3
BNDG CMPR 9X4 STRL LF SNTH (GAUZE/BANDAGES/DRESSINGS) ×1
BNDG ELASTIC 2X5.8 VLCR STR LF (GAUZE/BANDAGES/DRESSINGS) IMPLANT
BNDG ESMARK 4X9 LF (GAUZE/BANDAGES/DRESSINGS) ×2 IMPLANT
BNDG GAUZE ELAST 4 BULKY (GAUZE/BANDAGES/DRESSINGS) IMPLANT
CANISTER SUCT 1200ML W/VALVE (MISCELLANEOUS) IMPLANT
CLOSURE WOUND 1/2 X4 (GAUZE/BANDAGES/DRESSINGS)
CORDS BIPOLAR (ELECTRODE) IMPLANT
COVER BACK TABLE 60X90IN (DRAPES) ×3 IMPLANT
CUFF TOURNIQUET SINGLE 18IN (TOURNIQUET CUFF) ×2 IMPLANT
DECANTER SPIKE VIAL GLASS SM (MISCELLANEOUS) IMPLANT
DRAPE EXTREMITY T 121X128X90 (DRAPE) ×3 IMPLANT
DRAPE OEC MINIVIEW 54X84 (DRAPES) ×3 IMPLANT
DRAPE SURG 17X23 STRL (DRAPES) ×3 IMPLANT
DURAPREP 26ML APPLICATOR (WOUND CARE) ×3 IMPLANT
GAUZE SPONGE 4X4 12PLY STRL (GAUZE/BANDAGES/DRESSINGS) ×3 IMPLANT
GAUZE SPONGE 4X4 16PLY XRAY LF (GAUZE/BANDAGES/DRESSINGS) IMPLANT
GAUZE XEROFORM 1X8 LF (GAUZE/BANDAGES/DRESSINGS) ×2 IMPLANT
GLOVE BIO SURGEON STRL SZ 6.5 (GLOVE) ×1 IMPLANT
GLOVE BIO SURGEONS STRL SZ 6.5 (GLOVE) ×1
GLOVE BIOGEL PI IND STRL 7.0 (GLOVE) IMPLANT
GLOVE BIOGEL PI IND STRL 8 (GLOVE) IMPLANT
GLOVE BIOGEL PI INDICATOR 7.0 (GLOVE) ×2
GLOVE BIOGEL PI INDICATOR 8 (GLOVE) ×2
GLOVE SURG SYN 8.0 (GLOVE) ×6 IMPLANT
GLOVE SURG SYN 8.0 PF PI (GLOVE) ×2 IMPLANT
GOWN STRL REUS W/ TWL LRG LVL3 (GOWN DISPOSABLE) ×1 IMPLANT
GOWN STRL REUS W/TWL LRG LVL3 (GOWN DISPOSABLE) ×3
GOWN STRL REUS W/TWL XL LVL3 (GOWN DISPOSABLE) ×6 IMPLANT
NDL HYPO 25X1 1.5 SAFETY (NEEDLE) IMPLANT
NEEDLE HYPO 25X1 1.5 SAFETY (NEEDLE) IMPLANT
NS IRRIG 1000ML POUR BTL (IV SOLUTION) ×2 IMPLANT
PACK BASIN DAY SURGERY FS (CUSTOM PROCEDURE TRAY) ×3 IMPLANT
PAD CAST 3X4 CTTN HI CHSV (CAST SUPPLIES) ×1 IMPLANT
PAD CAST 4YDX4 CTTN HI CHSV (CAST SUPPLIES) IMPLANT
PADDING CAST ABS 4INX4YD NS (CAST SUPPLIES) ×2
PADDING CAST ABS COTTON 4X4 ST (CAST SUPPLIES) ×1 IMPLANT
PADDING CAST COTTON 3X4 STRL (CAST SUPPLIES) ×3
PADDING CAST COTTON 4X4 STRL (CAST SUPPLIES)
PADDING UNDERCAST 2 STRL (CAST SUPPLIES) ×2
PADDING UNDERCAST 2X4 STRL (CAST SUPPLIES) ×1 IMPLANT
SHEET MEDIUM DRAPE 40X70 STRL (DRAPES) ×3 IMPLANT
SPLINT PLASTER CAST XFAST 4X15 (CAST SUPPLIES) IMPLANT
SPLINT PLASTER XTRA FAST SET 4 (CAST SUPPLIES) ×20
STOCKINETTE 4X48 STRL (DRAPES) ×3 IMPLANT
STRIP CLOSURE SKIN 1/2X4 (GAUZE/BANDAGES/DRESSINGS) IMPLANT
SUCTION FRAZIER HANDLE 10FR (MISCELLANEOUS)
SUCTION TUBE FRAZIER 10FR DISP (MISCELLANEOUS) IMPLANT
SUT ETHILON 4 0 PS 2 18 (SUTURE) IMPLANT
SUT ETHILON 5 0 PS 2 18 (SUTURE) IMPLANT
SUT MERSILENE 4 0 P 3 (SUTURE) IMPLANT
SUT VIC AB 4-0 P-3 18XBRD (SUTURE) IMPLANT
SUT VIC AB 4-0 P3 18 (SUTURE)
SUT VICRYL RAPIDE 4-0 (SUTURE) IMPLANT
SUT VICRYL RAPIDE 4/0 PS 2 (SUTURE) IMPLANT
SYR 10ML LL (SYRINGE) IMPLANT
SYR BULB 3OZ (MISCELLANEOUS) IMPLANT
TOWEL OR 17X24 6PK STRL BLUE (TOWEL DISPOSABLE) ×3 IMPLANT
TUBE CONNECTING 20'X1/4 (TUBING)
TUBE CONNECTING 20X1/4 (TUBING) IMPLANT
UNDERPAD 30X30 (UNDERPADS AND DIAPERS) ×3 IMPLANT

## 2016-07-25 NOTE — Op Note (Signed)
NAME:  Ralph Clark, Ralph Clark                  ACCOUNT NO.:  [id number]  MEDICAL RECORD NO.:  [id number]  LOCATION:                                 FACILITY:  PHYSICIAN:  Artist PaisMatthew A. Antavion Bartoszek, M.D.DATE OF BIRTH:  09-14-1985  DATE OF PROCEDURE:  07/25/2016 DATE OF DISCHARGE:  07/25/2016                              OPERATIVE REPORT   PREOPERATIVE DIAGNOSIS:  Right small finger reverse Bennett fracture of the carpometacarpal joint.  POSTOPERATIVE DIAGNOSIS:  Right small finger reverse Bennett fracture of the carpometacarpal joint.  PROCEDURE:  Closed reduction and percutaneous pinning, right small finger reverse Bennett fracture of the carpometacarpal joint.  SURGEON:  Artist PaisMatthew A. Mina MarbleWeingold, M.D.  ASSISTANT:  None.  ANESTHESIA:  General.  COMPLICATIONS:  None.  DRAINS:  None.  DESCRIPTION OF PROCEDURE:  The patient was taken to the operating suite. After induction of adequate general anesthetic, right upper extremity was prepped and draped in usual sterile fashion.  An Esmarch was used to exsanguinate the limb.  Tourniquet was inflated to 250 mmHg.  At this point in time, a longitudinal traction downward pressure was placed at the [facility] joint of the right small finger.  Once this was done, fluoroscopy was used to determine that the fracture dislocation had been reduced.  We then drove two 0.045 K-wires from ulnar to radial across the fracture site into the base of the ring metacarpal.  Fluoroscopic imaging was used to determine adequate pin position.  The pins were cut outside the skin and bent upon themselves.  They were then dressed with Xeroform, 4x4s, and ulnar gutter splint.  The patient tolerated this procedure well and went to recovery room in stable fashion.    Artist PaisMatthew A. Mina MarbleWeingold, M.D.   ______________________________ Artist PaisMatthew A. Mina MarbleWeingold, M.D.   MAW/MEDQ  D:  07/25/2016  T:  07/25/2016  Job:  [id number]
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
## 2016-07-25 NOTE — Anesthesia Postprocedure Evaluation (Signed)
Anesthesia Post Note  Patient: Ralph Clark  Procedure(s) Performed: Procedure(s) (LRB): CLOSED REDUCTION RIGHT SMALL METACARPAL WITH PERCUTANEOUS PINNING (Right)  Patient location during evaluation: PACU Anesthesia Type: General Level of consciousness: awake and alert Pain management: pain level controlled Vital Signs Assessment: post-procedure vital signs reviewed and stable Respiratory status: spontaneous breathing, nonlabored ventilation, respiratory function stable and patient connected to nasal cannula oxygen Cardiovascular status: blood pressure returned to baseline and stable Postop Assessment: no signs of nausea or vomiting Anesthetic complications: no       Last Vitals:  Vitals:   07/25/16 1415 07/25/16 1430  BP: (!) 126/91 125/87  Pulse: 76 76  Resp: 14 (!) 22  Temp:      Last Pain:  Vitals:   07/25/16 1430  TempSrc:   PainSc: 3                  Kendi Defalco DAVID

## 2016-07-25 NOTE — Anesthesia Procedure Notes (Signed)
Procedure Name: Intubation Date/Time: 07/25/2016 1:14 PM Performed by: Skylyn Slezak D Pre-anesthesia Checklist: Patient identified, Emergency Drugs available, Suction available and Patient being monitored Patient Re-evaluated:Patient Re-evaluated prior to inductionOxygen Delivery Method: Circle system utilized Preoxygenation: Pre-oxygenation with 100% oxygen Intubation Type: IV induction and Cricoid Pressure applied Ventilation: Mask ventilation without difficulty Laryngoscope Size: Mac and 3 Grade View: Grade I Tube type: Oral Tube size: 7.0 mm Number of attempts: 1 Airway Equipment and Method: Stylet and Oral airway Placement Confirmation: ETT inserted through vocal cords under direct vision,  positive ETCO2 and breath sounds checked- equal and bilateral Secured at: 21 cm Tube secured with: Tape Dental Injury: Teeth and Oropharynx as per pre-operative assessment

## 2016-07-25 NOTE — Anesthesia Preprocedure Evaluation (Addendum)
Anesthesia Evaluation  Patient identified by MRN, date of birth, ID band Patient awake    Reviewed: Allergy & Precautions, NPO status , Patient's Chart, lab work & pertinent test results  Airway Mallampati: I  TM Distance: >3 FB Neck ROM: Full    Dental   Pulmonary former smoker,    Pulmonary exam normal        Cardiovascular negative cardio ROS Normal cardiovascular exam     Neuro/Psych negative neurological ROS  negative psych ROS   GI/Hepatic negative GI ROS, Neg liver ROS,   Endo/Other  negative endocrine ROS  Renal/GU negative Renal ROS  negative genitourinary   Musculoskeletal negative musculoskeletal ROS (+)   Abdominal   Peds negative pediatric ROS (+)  Hematology negative hematology ROS (+)   Anesthesia Other Findings   Reproductive/Obstetrics negative OB ROS                            Anesthesia Physical Anesthesia Plan  ASA: II  Anesthesia Plan: General   Post-op Pain Management:    Induction: Intravenous, Rapid sequence and Cricoid pressure planned  Airway Management Planned: Oral ETT  Additional Equipment:   Intra-op Plan:   Post-operative Plan: Extubation in OR  Informed Consent: I have reviewed the patients History and Physical, chart, labs and discussed the procedure including the risks, benefits and alternatives for the proposed anesthesia with the patient or authorized representative who has indicated his/her understanding and acceptance.   Dental advisory given  Plan Discussed with: CRNA and Surgeon  Anesthesia Plan Comments:       Anesthesia Quick Evaluation

## 2016-07-25 NOTE — Op Note (Deleted)
  The note originally documented on this encounter has been moved the the encounter in which it belongs.  

## 2016-07-25 NOTE — Op Note (Signed)
See dictated note [phone]

## 2016-07-25 NOTE — Transfer of Care (Signed)
Immediate Anesthesia Transfer of Care Note  Patient: Ralph Clark  Procedure(s) Performed: Procedure(s): CLOSED REDUCTION RIGHT SMALL METACARPAL WITH PERCUTANEOUS PINNING (Right)  Patient Location: PACU  Anesthesia Type:General  Level of Consciousness: awake, alert , oriented and patient cooperative  Airway & Oxygen Therapy: Patient Spontanous Breathing and Patient connected to face mask oxygen  Post-op Assessment: Report given to RN and Post -op Vital signs reviewed and stable  Post vital signs: Reviewed and stable  Last Vitals:  Vitals:   07/25/16 1127 07/25/16 1352  BP: 122/84   Pulse: 72 73  Resp: 20 11  Temp: 36.7 C     Last Pain:  Vitals:   07/25/16 1127  TempSrc: Oral  PainSc: 5       Patients Stated Pain Goal: 4 (07/25/16 1127)  Complications: No apparent anesthesia complications

## 2016-07-25 NOTE — Discharge Instructions (Signed)

## 2016-07-26 ENCOUNTER — Encounter (HOSPITAL_BASED_OUTPATIENT_CLINIC_OR_DEPARTMENT_OTHER): Payer: Self-pay | Admitting: Orthopedic Surgery

## 2017-02-09 ENCOUNTER — Emergency Department (HOSPITAL_COMMUNITY): Payer: No Typology Code available for payment source

## 2017-02-09 ENCOUNTER — Encounter (HOSPITAL_COMMUNITY): Payer: Self-pay | Admitting: Emergency Medicine

## 2017-02-09 ENCOUNTER — Emergency Department (HOSPITAL_COMMUNITY)
Admission: EM | Admit: 2017-02-09 | Discharge: 2017-02-09 | Disposition: A | Discharge status: Home / Self Care | Payer: No Typology Code available for payment source | Attending: Emergency Medicine | Admitting: Emergency Medicine

## 2017-02-09 DIAGNOSIS — Y939 Activity, unspecified: Secondary | ICD-10-CM | POA: Insufficient documentation

## 2017-02-09 DIAGNOSIS — S7001XA Contusion of right hip, initial encounter: Secondary | ICD-10-CM

## 2017-02-09 DIAGNOSIS — Y998 Other external cause status: Secondary | ICD-10-CM | POA: Insufficient documentation

## 2017-02-09 DIAGNOSIS — S8001XA Contusion of right knee, initial encounter: Secondary | ICD-10-CM | POA: Insufficient documentation

## 2017-02-09 DIAGNOSIS — R51 Headache: Secondary | ICD-10-CM | POA: Diagnosis not present

## 2017-02-09 DIAGNOSIS — Z87891 Personal history of nicotine dependence: Secondary | ICD-10-CM | POA: Diagnosis not present

## 2017-02-09 DIAGNOSIS — Y92410 Unspecified street and highway as the place of occurrence of the external cause: Secondary | ICD-10-CM | POA: Diagnosis not present

## 2017-02-09 DIAGNOSIS — S199XXA Unspecified injury of neck, initial encounter: Secondary | ICD-10-CM | POA: Diagnosis present

## 2017-02-09 DIAGNOSIS — S161XXA Strain of muscle, fascia and tendon at neck level, initial encounter: Secondary | ICD-10-CM | POA: Insufficient documentation

## 2017-02-09 MED ORDER — KETOROLAC TROMETHAMINE 60 MG/2ML IM SOLN
60.0000 mg | Freq: Once | INTRAMUSCULAR | Status: AC
  Administered 2017-02-09: 60 mg via INTRAMUSCULAR
  Filled 2017-02-09: qty 2

## 2017-02-09 MED ORDER — IBUPROFEN 800 MG PO TABS: 800.0000 mg | ORAL_TABLET | Freq: Three times a day (TID) | ORAL | 0 refills | Status: DC

## 2017-02-09 MED ORDER — CYCLOBENZAPRINE HCL 10 MG PO TABS: 10.0000 mg | ORAL_TABLET | Freq: Two times a day (BID) | ORAL | 0 refills | Status: DC | PRN

## 2017-02-09 NOTE — ED Notes (Signed)
Patient transported to X-ray 

## 2017-02-09 NOTE — ED Notes (Signed)
Patient transported to CT 
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
## 2017-02-09 NOTE — ED Notes (Signed)
Patient Alert and oriented X4. Stable and ambulatory. Patient verbalized understanding of the discharge instructions.  Patient belongings were taken by the patient.  
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
## 2017-10-06 ENCOUNTER — Encounter (HOSPITAL_COMMUNITY): Payer: Self-pay | Admitting: Emergency Medicine

## 2017-10-06 ENCOUNTER — Emergency Department (HOSPITAL_COMMUNITY): Payer: Self-pay

## 2017-10-06 DIAGNOSIS — Y929 Unspecified place or not applicable: Secondary | ICD-10-CM | POA: Insufficient documentation

## 2017-10-06 DIAGNOSIS — S60222A Contusion of left hand, initial encounter: Secondary | ICD-10-CM | POA: Insufficient documentation

## 2017-10-06 DIAGNOSIS — Y999 Unspecified external cause status: Secondary | ICD-10-CM | POA: Insufficient documentation

## 2017-10-06 DIAGNOSIS — S60221A Contusion of right hand, initial encounter: Secondary | ICD-10-CM | POA: Insufficient documentation

## 2017-10-06 DIAGNOSIS — W228XXA Striking against or struck by other objects, initial encounter: Secondary | ICD-10-CM | POA: Insufficient documentation

## 2017-10-06 DIAGNOSIS — Y9389 Activity, other specified: Secondary | ICD-10-CM | POA: Insufficient documentation

## 2017-10-06 DIAGNOSIS — Z87891 Personal history of nicotine dependence: Secondary | ICD-10-CM | POA: Insufficient documentation

## 2017-10-06 DIAGNOSIS — S63502A Unspecified sprain of left wrist, initial encounter: Secondary | ICD-10-CM | POA: Insufficient documentation
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
## 2017-10-07 ENCOUNTER — Emergency Department (HOSPITAL_COMMUNITY)
Admission: EM | Admit: 2017-10-07 | Discharge: 2017-10-07 | Disposition: A | Discharge status: Home / Self Care | Payer: Self-pay | Attending: Emergency Medicine | Admitting: Emergency Medicine

## 2017-10-07 DIAGNOSIS — S60222A Contusion of left hand, initial encounter: Secondary | ICD-10-CM

## 2017-10-07 DIAGNOSIS — S63502A Unspecified sprain of left wrist, initial encounter: Secondary | ICD-10-CM

## 2017-10-07 DIAGNOSIS — S60221A Contusion of right hand, initial encounter: Secondary | ICD-10-CM

## 2017-10-07 MED ORDER — IBUPROFEN 800 MG PO TABS
800.0000 mg | ORAL_TABLET | Freq: Once | ORAL | Status: AC
  Administered 2017-10-07: 800 mg via ORAL
  Filled 2017-10-07: qty 1

## 2017-10-07 MED ORDER — IBUPROFEN 800 MG PO TABS: 800.0000 mg | ORAL_TABLET | Freq: Three times a day (TID) | ORAL | 0 refills | Status: DC

## 2017-10-07 NOTE — ED Notes (Signed)
ED Provider at bedside. 
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
## 2018-03-11 ENCOUNTER — Encounter: Payer: Self-pay | Admitting: Internal Medicine

## 2018-03-11 ENCOUNTER — Ambulatory Visit: Payer: Self-pay | Admitting: Internal Medicine

## 2018-03-11 VITALS — BP 130/82 | HR 72 | Resp 12 | Ht 68.0 in | Wt 176.0 lb

## 2018-03-11 DIAGNOSIS — K920 Hematemesis: Secondary | ICD-10-CM

## 2018-03-11 DIAGNOSIS — K0889 Other specified disorders of teeth and supporting structures: Secondary | ICD-10-CM

## 2018-03-11 DIAGNOSIS — R1013 Epigastric pain: Secondary | ICD-10-CM

## 2018-03-11 MED ORDER — FAMOTIDINE 20 MG PO TABS: ORAL_TABLET | ORAL | 3 refills | Status: AC

## 2018-03-11 NOTE — Progress Notes (Signed)
Subjective:    Patient ID: Ralph Clark, male    DOB: [birth year], 32 y.o.   MRN: [id number]  HPI   Here to establish  1.  Dental Pain:  Bilateral dental pain--both upper and lower jaws.  [facility] about 3 weeks ago and was told he needs 5 teeth removed.  He had xrays.  He was treated with Amoxicillin and Metronidazole for abscessed teeth.  He did have significant swelling on both sides.  Swelling significantly decreased and pain decreased, but describes an abscess still in gingiva of left lower jaw.    2.  Epigastric pain with vomiting of blood at times.  Pepsi sets this off or if late or skips a meal.  Has been going on for 2 years.   Was taking Ibuprofen 200 mg 2-3 tabs daily when had a lot of swelling and pain with dental pain. Last episode of pain was yesterday.  Has been some time since vomited blood. Has had melena in the past, but was a while ago--2 years.  No outpatient medications have been marked as taking for the 03/11/18 encounter (Office Visit) with Julieanne MansonMulberry, Affan Callow, MD.    No Known Allergies   Past Medical History:  Diagnosis Date  . Gastritis 07/09/2016   seen in ED - instructed to follow-up with GI, but has not done so by 07/20/2016  . Metacarpal bone fracture 07/15/2016   right small  . Sinus infection 07/15/2016   started antibiotic 07/15/2016 x 10 days   Past Surgical History:  Procedure Laterality Date  . CLOSED REDUCTION METACARPAL WITH PERCUTANEOUS PINNING Right 07/25/2016   Procedure: CLOSED REDUCTION RIGHT SMALL METACARPAL WITH PERCUTANEOUS PINNING;  Surgeon: Dairl PonderMatthew Weingold, MD;  Location: [facility];  Service: Orthopedics;  Laterality: Right;  . NO PAST SURGERIES     Family History  Problem Relation Age of Onset  . Cancer Mother        Brain cancer:  reportedly cancer free now.  . Seizures Father   . Ulcers Father   . Seizures Sister   . Bipolar disorder Brother   . Schizophrenia Brother     . Asthma Sister    Social History   Socioeconomic History  . Marital status: Single    Spouse name: Not on file  . Number of children: Not on file  . Years of education: Not on file  . Highest education level: Not on file  Occupational History  . 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 Financial resource strain: Not on file  . Food insecurity:    Worry: Not on file    Inability: Not on file  . Transportation needs:    Medical: Not on file    Non-medical: Not on file  Tobacco Use  . Smoking status: Former Smoker    Packs/day: 0.00    Last attempt to quit: 01/20/2016    Years since quitting: 2.1  . Smokeless tobacco: Never Used  Substance and Sexual Activity  . Alcohol use: Not Currently  . Drug use: No  . Sexual activity: Yes    Birth control/protection: Inserts  Lifestyle  . Physical activity:    Days per week: Not on file    Minutes per session: Not on file  . Stress: Not on file  Relationships  . Social connections:    Talks on phone: Not on file    Gets together: Not on file  Attends religious service: Not on file    Active member of club or organization: Not on file    Attends meetings of clubs or organizations: Not on file    Relationship status: Not on file  . Intimate partner violence:    Fear of current or ex partner: Not on file    Emotionally abused: Not on file    Physically abused: Not on file    Forced sexual activity: Not on file  Other Topics Concern  . Not on file  Social History Narrative   Lives with girlfriend and their 2 young daughters.   No financial concerns currently       Review of Systems     Objective:   Physical Exam  NAD HEENT:  PERRL, EOMI, TMs pearly gray, diffuse and deep cavities of almost all molars with many molars missing.  Diffuse swelling of gingiva, particularly left sided. Neck:  Supple, No adenopathy Chest:  CTA CV: RRR without murmur or rub. Radial pulses normal and equal Abd:  S,  NT, No HSM or mass, + BS      Assessment & Plan:  1.  Terrible dental decay and gingival disease in patient with what sound like intermittent upper gi bleed from use of NSAIDs he takes for the pain. Expedited referral to dental clinic. No further antibiotics for now.  2.  Probable PUD with recent UGI bleed:  No NSAIDS, only Tylenol for pain.  Famotidine 40 mg at bedtime.  Follow up in 2 months.  Explained need to refill every month and stay on medication.

## 2018-03-11 NOTE — Patient Instructions (Signed)
NO  Ibuprofen, aspirin, goody powders, aleve or any other antiinflammatory drug. Only take Tylenol or Acetaminophen for pain.

## 2018-03-12 LAB — COMPREHENSIVE METABOLIC PANEL
ALT: 11 IU/L (ref 0–44)
AST: 18 IU/L (ref 0–40)
Albumin/Globulin Ratio: 1.8 (ref 1.2–2.2)
Albumin: 4.3 g/dL (ref 3.5–5.5)
Alkaline Phosphatase: 40 IU/L (ref 39–117)
BUN/Creatinine Ratio: 10 (ref 9–20)
BUN: 10 mg/dL (ref 6–20)
Bilirubin Total: 0.7 mg/dL (ref 0.0–1.2)
CALCIUM: 9.3 mg/dL (ref 8.7–10.2)
CO2: 26 mmol/L (ref 20–29)
Chloride: 103 mmol/L (ref 96–106)
Creatinine, Ser: 0.98 mg/dL (ref 0.76–1.27)
GFR calc Af Amer: 120 mL/min/{1.73_m2} (ref >59)
GFR, EST NON AFRICAN AMERICAN: 104 mL/min/{1.73_m2} (ref >59)
GLUCOSE: 90 mg/dL (ref 65–99)
Globulin, Total: 2.4 g/dL (ref 1.5–4.5)
Potassium: 4.5 mmol/L (ref 3.5–5.2)
Sodium: 141 mmol/L (ref 134–144)
TOTAL PROTEIN: 6.7 g/dL (ref 6.0–8.5)

## 2018-03-12 LAB — CBC WITH DIFFERENTIAL/PLATELET
BASOS ABS: 0 10*3/uL (ref 0.0–0.2)
BASOS: 1 %
EOS (ABSOLUTE): 0 10*3/uL (ref 0.0–0.4)
Eos: 1 %
Hematocrit: 42.9 % (ref 37.5–51.0)
Hemoglobin: 14.2 g/dL (ref 13.0–17.7)
IMMATURE GRANS (ABS): 0 10*3/uL (ref 0.0–0.1)
IMMATURE GRANULOCYTES: 0 %
LYMPHS: 48 %
Lymphocytes Absolute: 1.4 10*3/uL (ref 0.7–3.1)
MCH: 30.7 pg (ref 26.6–33.0)
MCHC: 33.1 g/dL (ref 31.5–35.7)
MCV: 93 fL (ref 79–97)
Monocytes Absolute: 0.3 10*3/uL (ref 0.1–0.9)
Monocytes: 10 %
NEUTROS PCT: 40 %
Neutrophils Absolute: 1.1 10*3/uL — ABNORMAL LOW (ref 1.4–7.0)
PLATELETS: 201 10*3/uL (ref 150–450)
RBC: 4.62 x10E6/uL (ref 4.14–5.80)
RDW: 14.1 % (ref 12.3–15.4)
WBC: 2.8 10*3/uL — AB (ref 3.4–10.8)

## 2018-05-13 ENCOUNTER — Encounter: Payer: Self-pay | Admitting: Internal Medicine

## 2018-05-13 ENCOUNTER — Ambulatory Visit: Payer: Self-pay | Admitting: Internal Medicine

## 2018-05-13 VITALS — BP 122/80 | HR 68 | Resp 12 | Ht 68.0 in | Wt 185.0 lb

## 2018-05-13 DIAGNOSIS — K0889 Other specified disorders of teeth and supporting structures: Secondary | ICD-10-CM

## 2018-05-13 DIAGNOSIS — R1013 Epigastric pain: Secondary | ICD-10-CM

## 2018-05-13 DIAGNOSIS — D72819 Decreased white blood cell count, unspecified: Secondary | ICD-10-CM

## 2018-05-13 NOTE — Progress Notes (Signed)
   Subjective:    Patient ID: Ralph Clark, male    DOB: September 23, 1985, 32 y.o.   MRN: [id number]  HPI  1.  Dental pain:  Has been to dentist twice.  Has had 5 teeth in total extracted.  Sounds like he is on Clindamycin three times daily. Last visit was 1 week ago.  Has two more teeth to extract. To go back in November.  2.  PUD:  Took Famotidine 40 mg for 1 month.  The burning feeling is significantly decreased and not having any vomiting.  Still taking ibuprofen.  Does not sound like he cut down on the latter.  He does have Tramadol for the dental pain. No melena or hematochezia.    3.  Low WBC count with last visit.  He did not understand the letter to come back and get his CBC rechecked.    Current Meds  Medication Sig  . ibuprofen (ADVIL,MOTRIN) 800 MG tablet Take 800 mg by mouth every 8 (eight) hours as needed.  . traMADol (ULTRAM) 50 MG tablet Take by mouth every 6 (six) hours as needed.    No Known Allergies Review of Systems     Objective:   Physical Exam Mild discomfort with hand over left jaw HEENT:  Conjunctivae a bit injected bilaterally.  Several missing teeth.  Left upper gingiva with opening showing what appears to be exposed bone.  No surrounding erythema.  No drainage. Neck:  Supple, No adenopathy Chest:  CTA CV:  RRR without murmur or rub.  Radial pulses normal and equal. Abd:  S, NT, No HSM or mass, + BS        Assessment & Plan:  1.  Dental pain:  To call in 1 week if gum not closing over bone.  Continue antibiotic  2.  PUD:  To get back on Famotidine 40 mg daily and limit ibuprofen.  Stop smoking.  Given quitline number for support, though sounds like rarely smokes.  Also to not smoke MJ.   If takes ibuprofen, only with food. Follow up in 2 months.  3.  Low WBC:  recheck

## 2018-05-13 NOTE — Patient Instructions (Addendum)
Call next week if your gum does not seem to be closing in over your bone  Get you flu shot at [facility]  Take your Famotidine daily for the next 2 months.

## 2018-05-14 LAB — CBC WITH DIFFERENTIAL/PLATELET
BASOS ABS: 0 10*3/uL (ref 0.0–0.2)
BASOS: 1 %
EOS (ABSOLUTE): 0.1 10*3/uL (ref 0.0–0.4)
Eos: 2 %
HEMOGLOBIN: 13.6 g/dL (ref 13.0–17.7)
Hematocrit: 40.3 % (ref 37.5–51.0)
IMMATURE GRANS (ABS): 0 10*3/uL (ref 0.0–0.1)
Immature Granulocytes: 0 %
LYMPHS: 39 %
Lymphocytes Absolute: 1.5 10*3/uL (ref 0.7–3.1)
MCH: 30.5 pg (ref 26.6–33.0)
MCHC: 33.7 g/dL (ref 31.5–35.7)
MCV: 90 fL (ref 79–97)
MONOCYTES: 11 %
Monocytes Absolute: 0.4 10*3/uL (ref 0.1–0.9)
Neutrophils Absolute: 1.8 10*3/uL (ref 1.4–7.0)
Neutrophils: 47 %
Platelets: 214 10*3/uL (ref 150–450)
RBC: 4.46 x10E6/uL (ref 4.14–5.80)
RDW: 13.3 % (ref 12.3–15.4)
WBC: 3.8 10*3/uL (ref 3.4–10.8)

## 2018-07-14 ENCOUNTER — Ambulatory Visit: Payer: Self-pay | Admitting: Internal Medicine

## 2018-07-18 ENCOUNTER — Ambulatory Visit: Payer: Self-pay | Admitting: Internal Medicine

## 2019-03-10 ENCOUNTER — Other Ambulatory Visit: Payer: Self-pay | Admitting: Internal Medicine

## 2019-03-10 DIAGNOSIS — Z20822 Contact with and (suspected) exposure to covid-19: Secondary | ICD-10-CM

## 2019-03-10 NOTE — Progress Notes (Signed)
LAB

## 2019-03-11 LAB — NOVEL CORONAVIRUS, NAA: SARS-CoV-2, NAA: NOT DETECTED

## 2019-11-18 ENCOUNTER — Emergency Department (HOSPITAL_COMMUNITY)
Admission: EM | Admit: 2019-11-18 | Discharge: 2019-11-18 | Disposition: A | Discharge status: Home / Self Care | Payer: Self-pay | Attending: Emergency Medicine | Admitting: Emergency Medicine

## 2019-11-18 ENCOUNTER — Encounter (HOSPITAL_COMMUNITY): Payer: Self-pay | Admitting: Emergency Medicine

## 2019-11-18 ENCOUNTER — Other Ambulatory Visit: Payer: Self-pay

## 2019-11-18 DIAGNOSIS — R519 Headache, unspecified: Secondary | ICD-10-CM | POA: Insufficient documentation

## 2019-11-18 DIAGNOSIS — Z5321 Procedure and treatment not carried out due to patient leaving prior to being seen by health care provider: Secondary | ICD-10-CM | POA: Insufficient documentation

## 2019-11-18 NOTE — ED Triage Notes (Signed)
Pt reports has rotten tooth that needs to come out. Reports for over week having intermittent right side pains that radiates to head.

## 2019-12-13 ENCOUNTER — Other Ambulatory Visit: Payer: Self-pay

## 2019-12-13 ENCOUNTER — Emergency Department (HOSPITAL_COMMUNITY): Payer: Self-pay

## 2019-12-13 ENCOUNTER — Emergency Department (HOSPITAL_COMMUNITY)
Admission: EM | Admit: 2019-12-13 | Discharge: 2019-12-13 | Disposition: A | Discharge status: Home / Self Care | Payer: Self-pay | Attending: Emergency Medicine | Admitting: Emergency Medicine

## 2019-12-13 DIAGNOSIS — J020 Streptococcal pharyngitis: Secondary | ICD-10-CM

## 2019-12-13 DIAGNOSIS — F17210 Nicotine dependence, cigarettes, uncomplicated: Secondary | ICD-10-CM | POA: Insufficient documentation

## 2019-12-13 DIAGNOSIS — R030 Elevated blood-pressure reading, without diagnosis of hypertension: Secondary | ICD-10-CM

## 2019-12-13 DIAGNOSIS — K047 Periapical abscess without sinus: Secondary | ICD-10-CM

## 2019-12-13 LAB — CBC WITH DIFFERENTIAL/PLATELET
Abs Immature Granulocytes: 0.02 10*3/uL (ref 0.00–0.07)
Basophils Absolute: 0 10*3/uL (ref 0.0–0.1)
Basophils Relative: 1 %
Eosinophils Absolute: 0.1 10*3/uL (ref 0.0–0.5)
Eosinophils Relative: 1 %
HCT: 46.5 % (ref 39.0–52.0)
Hemoglobin: 15.5 g/dL (ref 13.0–17.0)
Immature Granulocytes: 0 %
Lymphocytes Relative: 28 %
Lymphs Abs: 1.4 10*3/uL (ref 0.7–4.0)
MCH: 32.2 pg (ref 26.0–34.0)
MCHC: 33.3 g/dL (ref 30.0–36.0)
MCV: 96.5 fL (ref 80.0–100.0)
Monocytes Absolute: 0.6 10*3/uL (ref 0.1–1.0)
Monocytes Relative: 11 %
Neutro Abs: 2.9 10*3/uL (ref 1.7–7.7)
Neutrophils Relative %: 59 %
Platelets: 222 10*3/uL (ref 150–400)
RBC: 4.82 MIL/uL (ref 4.22–5.81)
RDW: 13.4 % (ref 11.5–15.5)
WBC: 4.9 10*3/uL (ref 4.0–10.5)
nRBC: 0 % (ref 0.0–0.2)

## 2019-12-13 LAB — COMPREHENSIVE METABOLIC PANEL
ALT: 19 U/L (ref 0–44)
AST: 28 U/L (ref 15–41)
Albumin: 4.2 g/dL (ref 3.5–5.0)
Alkaline Phosphatase: 43 U/L (ref 38–126)
Anion gap: 7 (ref 5–15)
BUN: 15 mg/dL (ref 6–20)
CO2: 25 mmol/L (ref 22–32)
Calcium: 8.8 mg/dL — ABNORMAL LOW (ref 8.9–10.3)
Chloride: 106 mmol/L (ref 98–111)
Creatinine, Ser: 1.04 mg/dL (ref 0.61–1.24)
GFR calc Af Amer: >60 mL/min (ref >60)
GFR calc non Af Amer: >60 mL/min (ref >60)
Glucose, Bld: 109 mg/dL — ABNORMAL HIGH (ref 70–99)
Potassium: 5.3 mmol/L — ABNORMAL HIGH (ref 3.5–5.1)
Sodium: 138 mmol/L (ref 135–145)
Total Bilirubin: 1.1 mg/dL (ref 0.3–1.2)
Total Protein: 7.4 g/dL (ref 6.5–8.1)

## 2019-12-13 LAB — GROUP A STREP BY PCR: Group A Strep by PCR: DETECTED — AB

## 2019-12-13 MED ORDER — IBUPROFEN 200 MG PO TABS
600.0000 mg | ORAL_TABLET | Freq: Once | ORAL | Status: AC
  Administered 2019-12-13: 600 mg via ORAL
  Filled 2019-12-13: qty 3

## 2019-12-13 MED ORDER — ACETAMINOPHEN 500 MG PO TABS
1000.0000 mg | ORAL_TABLET | Freq: Once | ORAL | Status: AC
  Administered 2019-12-13: 1000 mg via ORAL
  Filled 2019-12-13: qty 2

## 2019-12-13 MED ORDER — AMOXICILLIN-POT CLAVULANATE 875-125 MG PO TABS: 1.0000 | ORAL_TABLET | Freq: Two times a day (BID) | ORAL | 0 refills | Status: AC

## 2019-12-13 MED ORDER — SODIUM CHLORIDE 0.9 % IV BOLUS
1000.0000 mL | Freq: Once | INTRAVENOUS | Status: AC
  Administered 2019-12-13: 1000 mL via INTRAVENOUS

## 2019-12-13 MED ORDER — SODIUM CHLORIDE (PF) 0.9 % IJ SOLN
INTRAMUSCULAR | Status: AC
  Filled 2019-12-13: qty 50

## 2019-12-13 MED ORDER — IOHEXOL 300 MG/ML  SOLN
75.0000 mL | Freq: Once | INTRAMUSCULAR | Status: AC | PRN
  Administered 2019-12-13: 75 mL via INTRAVENOUS

## 2019-12-13 MED ORDER — DEXAMETHASONE SODIUM PHOSPHATE 10 MG/ML IJ SOLN
10.0000 mg | Freq: Once | INTRAMUSCULAR | Status: AC
  Administered 2019-12-13: 10 mg via INTRAVENOUS
  Filled 2019-12-13: qty 1

## 2019-12-13 MED ORDER — AMOXICILLIN-POT CLAVULANATE 875-125 MG PO TABS
1.0000 | ORAL_TABLET | Freq: Once | ORAL | Status: AC
  Administered 2019-12-13: 1 via ORAL
  Filled 2019-12-13: qty 1

## 2019-12-13 NOTE — ED Provider Notes (Signed)
[facility]-EMERGENCY DEPT Provider Note   CSN: [id number] Arrival date & time: 12/13/19  1036     History Chief Complaint  Patient presents with  . Sore Throat    Ralph Clark is a 34 y.o. male who presents today for evaluation of multiple complaints.  He reports that over the past 2 weeks he has had intermittent pain and swelling on the right side of his face.  He states that he has multiple bad teeth on the right maxilla and that this feels similar to when he has had a dental infection in the past.  He reports fevers occasionally up to 101 however they are not consistent.  He denies any headache.  He states that today when he woke up he has had pain on the right side of his face and in his neck and throat.  He denies any known sick contacts.  Is that his voice sounds different.  He has been trying hot and cold compresses without significant relief.  Last tetanus shot was within the past 10 years.  He denies any trauma.  He is able to swallow however notes that it hurts to both open his mouth and to swallow.    HPI     Past Medical History:  Diagnosis Date  . Gastritis 07/09/2016   seen in ED - instructed to follow-up with GI, but has not done so by 07/20/2016  . Metacarpal bone fracture 07/15/2016   right small  . Sinus infection 07/15/2016   started antibiotic 07/15/2016 x 10 days    Patient Active Problem List   Diagnosis Date Noted  . Hematemesis 03/11/2018    Past Surgical History:  Procedure Laterality Date  . CLOSED REDUCTION METACARPAL WITH PERCUTANEOUS PINNING Right 07/25/2016   Procedure: CLOSED REDUCTION RIGHT SMALL METACARPAL WITH PERCUTANEOUS PINNING;  Surgeon: Dairl Ponder, MD;  [facility];  Service: Orthopedics;  Laterality: Right;  . NO PAST SURGERIES         Family History  Problem Relation Age of Onset  . Cancer Mother        Brain cancer:  reportedly cancer free now.  . Seizures Father   . Ulcers  Father   . Seizures Sister   . Bipolar disorder Brother   . Schizophrenia Brother   . Asthma Sister   . Congenital heart disease Daughter        ASD  . Premature birth Daughter        delivered at 23 months    Social History   Tobacco Use  . Smoking status: Current Some Day Smoker    Packs/day: 0.00    Last attempt to quit: 05/09/2018    Years since quitting: 1.5  . Smokeless tobacco: Never Used  Substance Use Topics  . Alcohol use: Not Currently  . Drug use: Yes    Types: Marijuana    Comment: once weekly.    Home Medications Prior to Admission medications   Medication Sig Start Date End Date Taking? Authorizing Provider  ibuprofen (ADVIL,MOTRIN) 800 MG tablet Take 800 mg by mouth every 8 (eight) hours as needed.   Yes [provider]  amoxicillin-clavulanate (AUGMENTIN) 875-125 MG tablet Take 1 tablet by mouth every 12 (twelve) hours for 10 days. 12/13/19 12/23/19  Cristina Gong, PA-C  famotidine (PEPCID) 20 MG tablet 2 tabs by mouth at bedtime Patient not taking: Reported on 05/13/2018 03/11/18   Julieanne Manson, MD    Allergies  Patient has no known allergies.  Review of Systems   Review of Systems  Constitutional: Positive for chills, fatigue and fever.  HENT: Positive for dental problem, facial swelling, sore throat and voice change.   Eyes: Negative for visual disturbance.  Respiratory: Negative for cough and chest tightness.   Cardiovascular: Negative for chest pain.  Neurological: Negative for weakness and headaches.  All other systems reviewed and are negative.   Physical Exam Updated Vital Signs BP (!) 149/88 (BP Location: Left Arm)   Pulse 66   Temp 98.7 F (37.1 C) (Oral)   Resp 18   Ht 5\' 9"  (1.753 m)   SpO2 97%   BMI 27.32 kg/m   Physical Exam Vitals and nursing note reviewed.  Constitutional:      General: He is not in acute distress.    Appearance: He is well-developed. He is not diaphoretic.  HENT:     Head:  Normocephalic and atraumatic.     Right Ear: Tympanic membrane and ear canal normal. No drainage.     Left Ear: Tympanic membrane and ear canal normal. No drainage.     Mouth/Throat:     Mouth: Mucous membranes are moist.     Pharynx: Uvula midline. Posterior oropharyngeal erythema present. No uvula swelling.     Tonsils: Tonsillar exudate present. No tonsillar abscesses. 3+ on the right. 3+ on the left.     Comments: Trismus present on exam.  Generally poor state of dentition with multiple carious and broken teeth.  There is no obvious intraoral abscess. Eyes:     General: No scleral icterus.       Right eye: No discharge.        Left eye: No discharge.     Conjunctiva/sclera: Conjunctivae normal.  Neck:     Comments: No elevation of the floor of the mouth or obvious facial/submandibular swelling.  Objectively no muffled voice. Cardiovascular:     Rate and Rhythm: Normal rate and regular rhythm.     Heart sounds: Normal heart sounds.  Pulmonary:     Effort: Pulmonary effort is normal. No respiratory distress.     Breath sounds: Normal breath sounds. No stridor.  Abdominal:     General: There is no distension.  Musculoskeletal:        General: No deformity.     Cervical back: Normal range of motion.  Lymphadenopathy:     Cervical: Cervical adenopathy (Right sided) present.  Skin:    General: Skin is warm and dry.  Neurological:     General: No focal deficit present.     Mental Status: He is alert.     Motor: No abnormal muscle tone.  Psychiatric:        Mood and Affect: Mood normal.        Behavior: Behavior normal.     ED Results / Procedures / Treatments   Labs (all labs ordered are listed, but only abnormal results are displayed) Labs Reviewed  GROUP A STREP BY PCR - Abnormal; Notable for the following components:      Result Value   Group A Strep by PCR DETECTED (*)    All other components within normal limits  COMPREHENSIVE METABOLIC PANEL - Abnormal; Notable for  the following components:   Potassium 5.3 (*)    Glucose, Bld 109 (*)    Calcium 8.8 (*)    All other components within normal limits  CBC WITH DIFFERENTIAL/PLATELET    EKG None  Radiology CT Soft Tissue  Neck W Contrast  Result Date: 12/13/2019 CLINICAL DATA:  34 year old male with 2 weeks of facial pain and swelling on the right side. Trismus. Muffled voice. Poor dentition. EXAM: CT NECK WITH CONTRAST TECHNIQUE: Multidetector CT imaging of the neck was performed using the standard protocol following the bolus administration of intravenous contrast. CONTRAST:  11mL OMNIPAQUE IOHEXOL 300 MG/ML  SOLN COMPARISON:  CT head and cervical spine 02/09/2017. FINDINGS: Pharynx and larynx: Pharyngeal and laryngeal soft tissue contours are within normal limits. Normal epiglottis. The parapharyngeal and retropharyngeal spaces are normal. Salivary glands: Negative sublingual space. Submandibular glands, submandibular spaces, and parotid glands are within normal limits. The intervening masticator spaces also seem to remain normal. Thyroid: Negative. Lymph nodes: Mildly asymmetric right level 2 lymph nodes with a right level 2A node measuring 13 mm short axis on series 2, image 63. Other regional nodes are also mildly enlarged compared to the left side. No cystic or necrotic nodes. Other bilateral nodal stations are normal. Vascular: Major vascular structures in the neck and at the skull base are patent and appear normal, including the right IJ. Both ICAs are tortuous just below the skull base. Limited intracranial: Negative; cavum septum pellucidum (normal variant). Visualized orbits: Negative. Mastoids and visualized paranasal sinuses: Clear aside from minor bilateral maxillary sinus alveolar recess mucosal thickening. Bilateral tympanic cavities, mastoids, petrous apex and sphenoid wing air cells are clear. Skeleton: Carious right side maxillary dentition, especially the posterior right maxillary molar and anterior  right maxillary bicuspid. These have progressed since 2018. At the former there is a 7 mm area of right maxillary alveolar process periapical lucency (series 3, image 47), although no subperiosteal abscess identified. And there is no definite asymmetric buccal space soft tissue swelling or inflammation. Carious mandible dentition seen in 2018 has been extracted. The mandible appears intact. Other visible osseous structures appear intact and normal. Upper chest: Mild periapical emphysema in the lung apices greater on the right. Normal visible mediastinum. Normal visible trachea. IMPRESSION: 1. 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 But no subperiosteal abscess, cellulitis, or other complicating features. 2. There is mild reactive appearing right level 2 lymphadenopathy. No suppurative nodes. Electronically Signed   By: Odessa Fleming M.D.   On: 12/13/2019 14:06    Procedures Procedures (including critical care time)  Medications Ordered in ED Medications  sodium chloride 0.9 % bolus 1,000 mL (0 mLs Intravenous Stopped 12/13/19 1445)  dexamethasone (DECADRON) injection 10 mg (10 mg Intravenous Given 12/13/19 1205)  iohexol (OMNIPAQUE) 300 MG/ML solution 75 mL (75 mLs Intravenous Contrast Given 12/13/19 1328)  amoxicillin-clavulanate (AUGMENTIN) 875-125 MG per tablet 1 tablet (1 tablet Oral Given 12/13/19 1445)  ibuprofen (ADVIL) tablet 600 mg (600 mg Oral Given 12/13/19 1445)  acetaminophen (TYLENOL) tablet 1,000 mg (1,000 mg Oral Given 12/13/19 1445)    ED Course  I have reviewed the triage vital signs and the nursing notes.  Pertinent labs & imaging results that were available during my care of the patient were reviewed by me and considered in my medical decision making (see chart for details).    MDM Rules/Calculators/A&P                     Patient is a 34 year old man who presents today for evaluation of dental pain  with swelling over the past 2 weeks who woke up with sore throat and neck pain today.  On exam he has significant trismus.  He states that his voice sounds different however overall does not sound like a muffled hot potato voice.  In addition he reports fevers.  Based on his trismus concern for spread of infection.  Labs are obtained and reviewed he does not have a significant leukocytosis or anemia.  CMP shows a potassium of 5.3 which I suspect is related to hemolysis as patient does not take any potassium supplements and has normal renal function.  Strep test is positive.  With his trismus this further raises concern for a spread of infection. CT soft tissue was obtained of the neck and the lower face showing dental disease without deep space spread of infection and some right-sided lymphadenopathy is consistent with his reported symptoms and pain.  In the emergency room he was treated with saline, Decadron, ibuprofen, Tylenol, and Augmentin p.o.  He is given a prescription for continued Augmentin as an outpatient.  He is informed that he is strep positive and that he is contagious for 48 hours after the initiation of antibiotics.  We discussed the importance of dental follow-up including that if not appropriately treated by a dentist his dental infection will recur and he states his understanding.  Additionally patient's blood pressure is elevated while in the emergency room.  I counseled him at length of the risk of untreated hypertension.  He states his understanding and that he will follow up to get it rechecked in the next month as an outpatient.  Return precautions were discussed with patient who states their understanding.  At the time of discharge patient denied any unaddressed complaints or concerns.  Patient is agreeable for discharge home.  Note: Portions of this report may have been transcribed using voice recognition software. Every effort was made to ensure accuracy; however, inadvertent  computerized transcription errors may be present   Final Clinical Impression(s) / ED Diagnoses Final diagnoses:  Strep throat  Dental infection  Elevated BP without diagnosis of hypertension    Rx / DC Orders ED Discharge Orders         Ordered    amoxicillin-clavulanate (AUGMENTIN) 875-125 MG tablet  Every 12 hours     12/13/19 1501           Ollen Gross 12/13/19 2126    Maudie Flakes, MD 12/18/19 2320

## 2019-12-13 NOTE — Discharge Instructions (Addendum)

## 2019-12-13 NOTE — ED Triage Notes (Signed)
Per patient, he has had facial swelling on right side and sore throat for a week. Patient states he has rotten tooth on same side.

## 2022-02-03 IMAGING — CT CT NECK W/ CM
2 series · 15 of 20 positions shown, 18 images · IV contrast (omnipaque)
Comparison: CT head and cervical spine 02/09/2017.

CLINICAL DATA: 33-year-old male with 2 weeks of facial pain and
swelling on the right side. Trismus. Muffled voice. Poor dentition.

EXAM:
CT NECK WITH CONTRAST
TECHNIQUE: Multidetector CT imaging of the neck was performed using the
standard protocol following the bolus administration of intravenous
contrast.
CONTRAST:  75mL OMNIPAQUE IOHEXOL 300 MG/ML  SOLN

[Series 2: axial neck · axial · 0.52mm/px · z∈[-333,-105]mm · 12 of 136 slices shown, 15 images]
[im 11/136  soft-tissue]
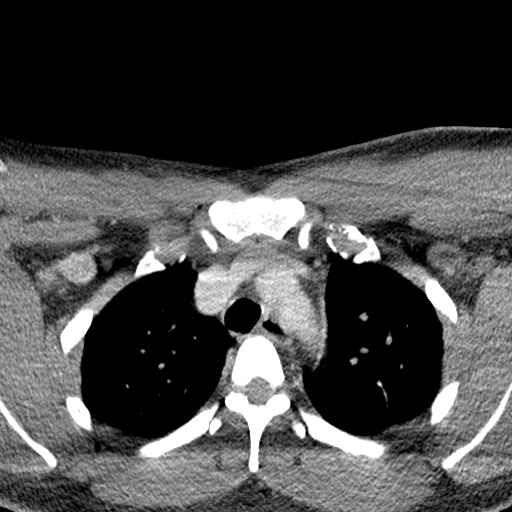
[im 11/136  bone]
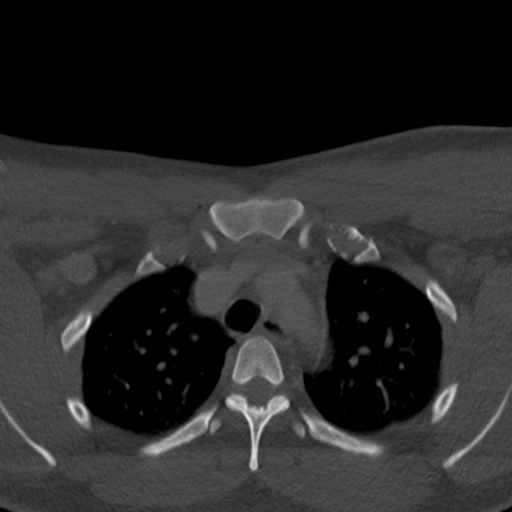
[im 21/136  bone]
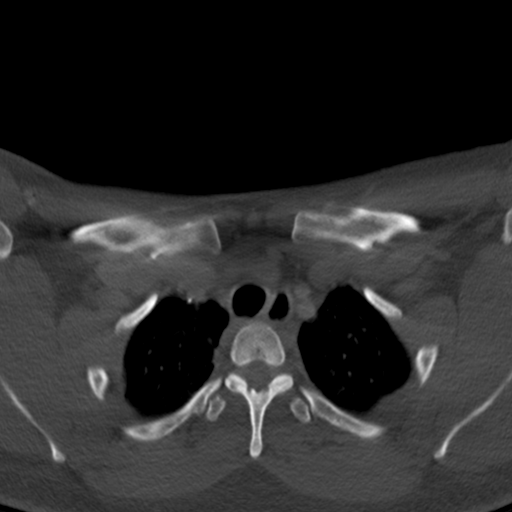
[im 32/136  bone]
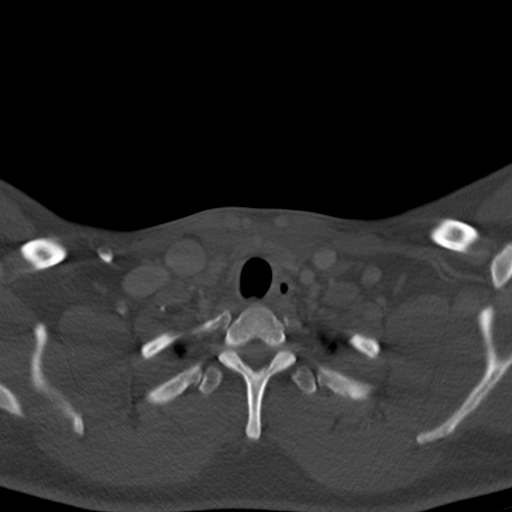
[im 42/136  bone]
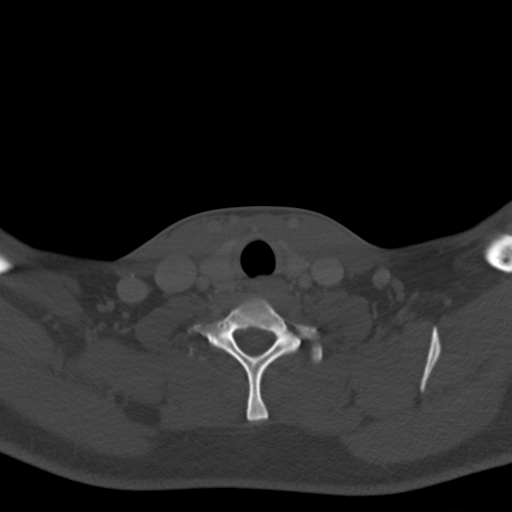
[im 52/136  soft-tissue]
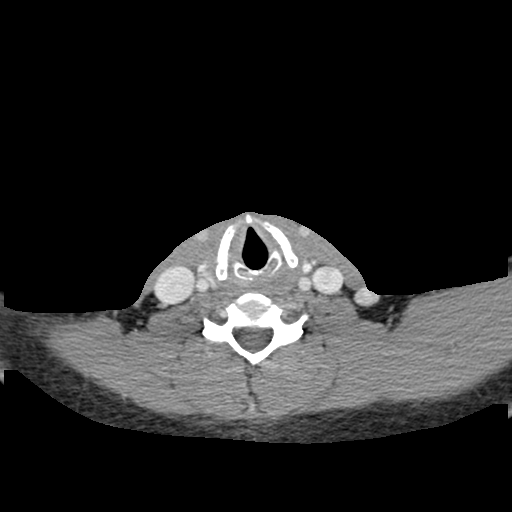
[im 52/136  bone]
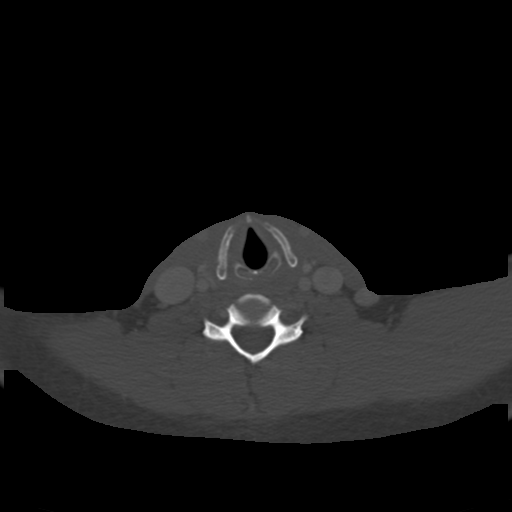
[im 63/136  bone]
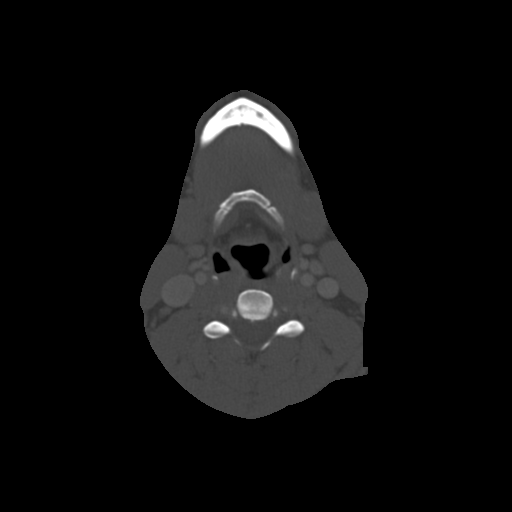
[im 73/136  bone]
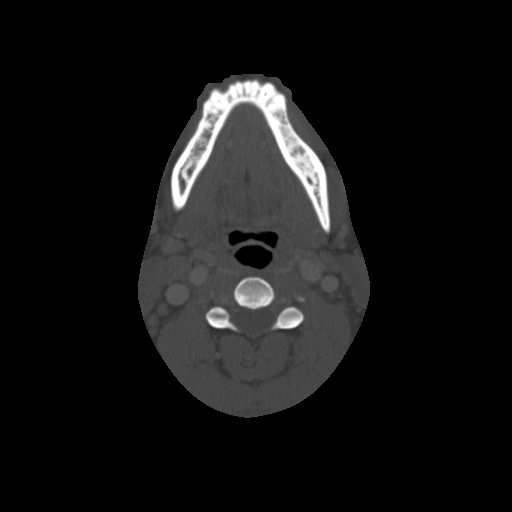
[im 84/136  bone]
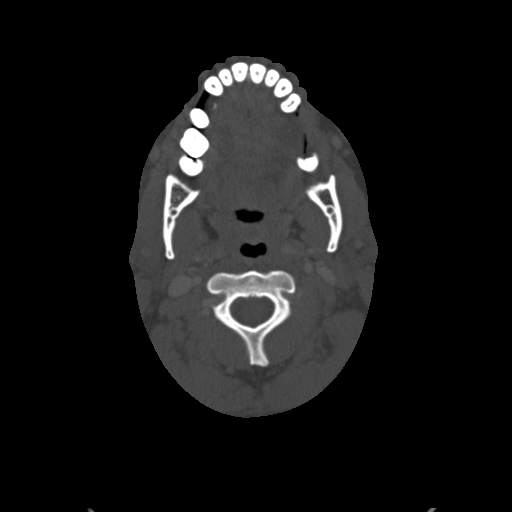
[im 94/136  soft-tissue]
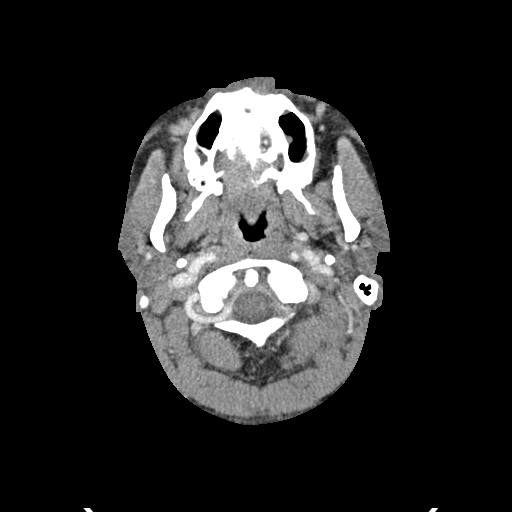
[im 94/136  bone]
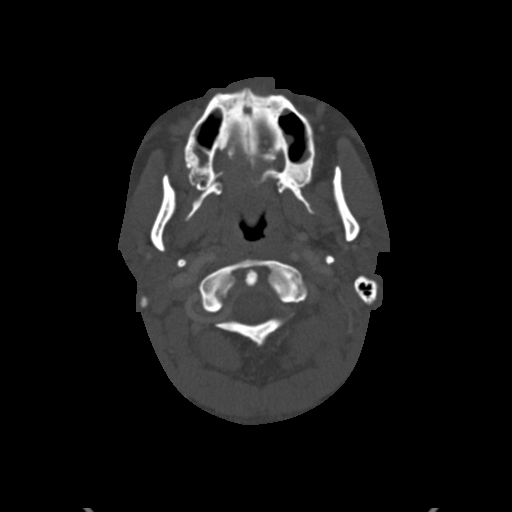
[im 104/136  bone]
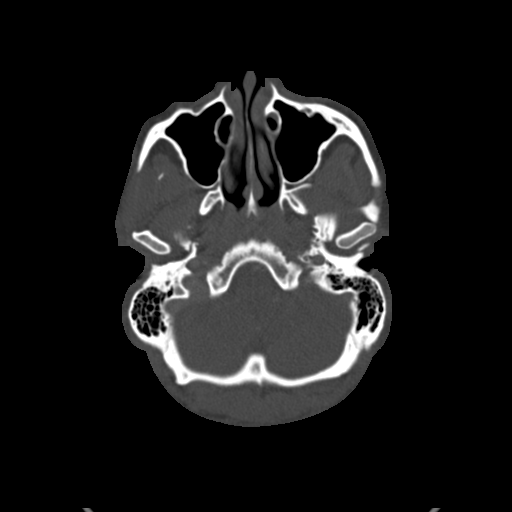
[im 115/136  bone]
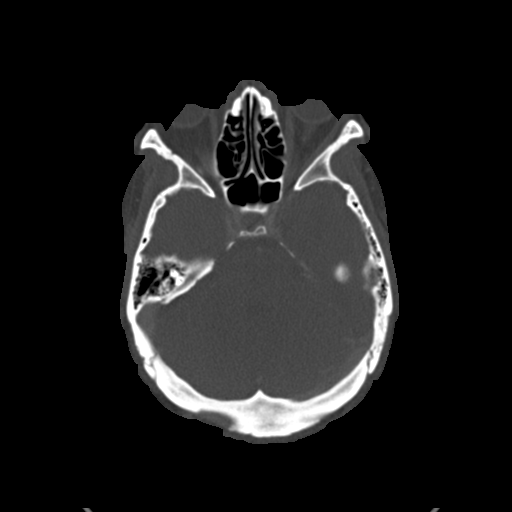
[im 125/136  bone]
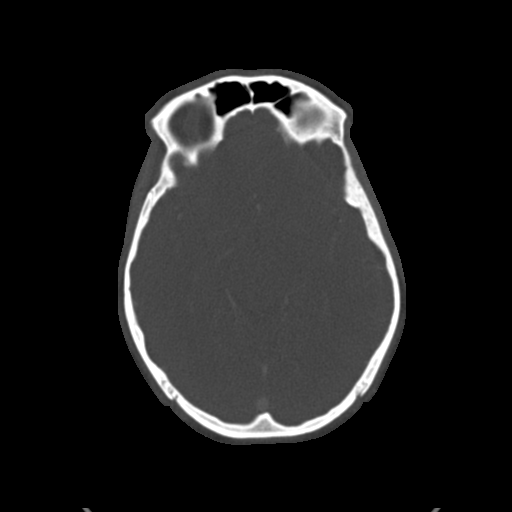

[Series 5: coronal · coronal · 0.59mm/px · 3 of 116 slices shown]
[im 26/116  bone]
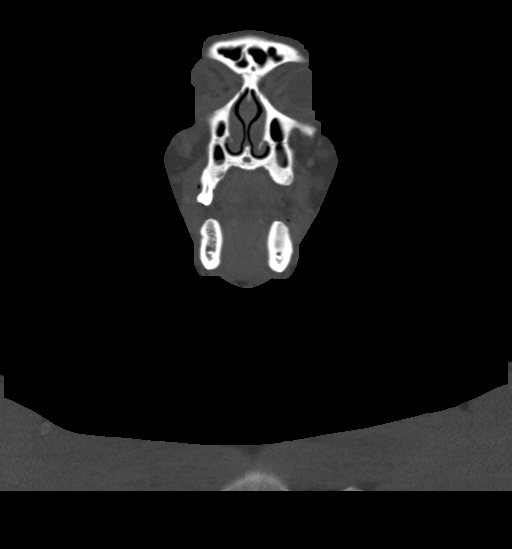
[im 47/116  bone]
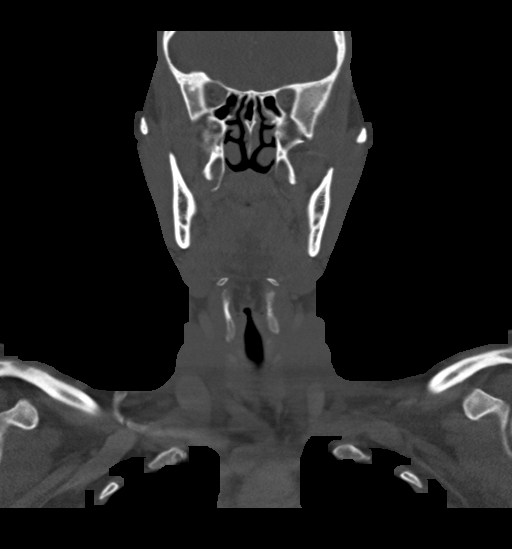
[im 69/116  bone]
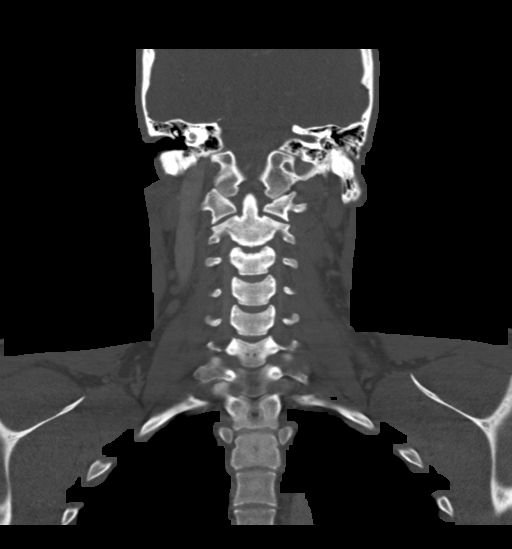

[15 of 20 positions shown; findings below may reference images not displayed]

FINDINGS: Pharynx and larynx: Pharyngeal and laryngeal soft tissue contours
are within normal limits. Normal epiglottis. The parapharyngeal and
retropharyngeal spaces are normal.

Salivary glands: Negative sublingual space. Submandibular glands,
submandibular spaces, and parotid glands are within normal limits.

The intervening masticator spaces also seem to remain normal.

Thyroid: Negative.

Lymph nodes: Mildly asymmetric right level 2 lymph nodes with a
right level 2A node measuring 13 mm short axis on series 2, image
63. Other regional nodes are also mildly enlarged compared to the
left side. No cystic or necrotic nodes.

Other bilateral nodal stations are normal.

Vascular: Major vascular structures in the neck and at the skull
base are patent and appear normal, including the right IJ. Both ICAs
are tortuous just below the skull base.

Limited intracranial: Negative; cavum septum pellucidum (normal
variant).

Visualized orbits: Negative.

Mastoids and visualized paranasal sinuses: Clear aside from minor
bilateral maxillary sinus alveolar recess mucosal thickening.
Bilateral tympanic cavities, mastoids, petrous apex and sphenoid
wing air cells are clear.

Skeleton: Carious right side maxillary dentition, especially the
posterior right maxillary molar and anterior right maxillary
bicuspid. These have progressed since 2036. At the former there is a
7 mm area of right maxillary alveolar process periapical lucency
(series 3, image 47), although no subperiosteal abscess identified.
And there is no definite asymmetric buccal space soft tissue
swelling or inflammation.

Carious mandible dentition seen in 2036 has been extracted. The
mandible appears intact.

Other visible osseous structures appear intact and normal.

Upper chest: Mild periapical emphysema in the lung apices greater on
the right. Normal visible mediastinum. Normal visible trachea.
IMPRESSION: 1. Carious right maxillary anterior bicuspid and posterior molar,
with maxilla periapical lucency associated with the former. But no
subperiosteal abscess, cellulitis, or other complicating features.

2. There is mild reactive appearing right level 2 lymphadenopathy.
No suppurative nodes.

## 2023-08-04 ENCOUNTER — Ambulatory Visit (HOSPITAL_COMMUNITY)
Admission: EM | Admit: 2023-08-04 | Discharge: 2023-08-04 | Discharge status: Against Medical Advice | Payer: Medicaid Other

## 2023-08-04 NOTE — ED Notes (Addendum)
 Called pt once from the waiting room and once via phone
